# Patient Record
Sex: Female | Born: 1985 | Race: Black or African American | Hispanic: No | Marital: Single | Smoking: Never smoker
Health system: Southern US, Community
[De-identification: ages and names within clinical notes are randomized; demographics above are authoritative.]

## PROBLEM LIST (undated history)

## (undated) ENCOUNTER — Inpatient Hospital Stay (HOSPITAL_COMMUNITY): Payer: Self-pay

## (undated) DIAGNOSIS — Z8759 Personal history of other complications of pregnancy, childbirth and the puerperium: Secondary | ICD-10-CM

## (undated) DIAGNOSIS — IMO0001 Reserved for inherently not codable concepts without codable children: Secondary | ICD-10-CM

## (undated) DIAGNOSIS — O039 Complete or unspecified spontaneous abortion without complication: Secondary | ICD-10-CM

## (undated) HISTORY — PX: INDUCED ABORTION: SHX677

## (undated) HISTORY — DX: Reserved for inherently not codable concepts without codable children: IMO0001

## (undated) HISTORY — PX: WISDOM TOOTH EXTRACTION: SHX21

## (undated) HISTORY — DX: Complete or unspecified spontaneous abortion without complication: O03.9

## (undated) HISTORY — DX: Personal history of other complications of pregnancy, childbirth and the puerperium: Z87.59

---

## 2006-12-16 ENCOUNTER — Emergency Department (HOSPITAL_COMMUNITY): Admission: EM | Admit: 2006-12-16 | Discharge: 2006-12-16 | Payer: Self-pay | Admitting: Family Medicine

## 2006-12-22 ENCOUNTER — Emergency Department (HOSPITAL_COMMUNITY): Admission: EM | Admit: 2006-12-22 | Discharge: 2006-12-22 | Payer: Self-pay | Admitting: Emergency Medicine

## 2007-10-18 ENCOUNTER — Emergency Department (HOSPITAL_COMMUNITY): Admission: EM | Admit: 2007-10-18 | Discharge: 2007-10-18 | Payer: Self-pay | Admitting: Emergency Medicine

## 2011-06-22 LAB — WET PREP, GENITAL: Clue Cells Wet Prep HPF POC: NONE SEEN

## 2011-06-22 LAB — POCT URINALYSIS DIP (DEVICE)
Nitrite: NEGATIVE
Protein, ur: 30 — AB
Urobilinogen, UA: 0.2
pH: 6

## 2011-06-22 LAB — GC/CHLAMYDIA PROBE AMP, GENITAL: Chlamydia, DNA Probe: NEGATIVE

## 2011-06-22 LAB — POCT PREGNANCY, URINE
Operator id: 270961
Preg Test, Ur: NEGATIVE

## 2012-09-09 ENCOUNTER — Emergency Department (HOSPITAL_BASED_OUTPATIENT_CLINIC_OR_DEPARTMENT_OTHER): Payer: BC Managed Care – PPO

## 2012-09-09 ENCOUNTER — Encounter (HOSPITAL_BASED_OUTPATIENT_CLINIC_OR_DEPARTMENT_OTHER): Payer: Self-pay | Admitting: *Deleted

## 2012-09-09 ENCOUNTER — Emergency Department (HOSPITAL_BASED_OUTPATIENT_CLINIC_OR_DEPARTMENT_OTHER)
Admission: EM | Admit: 2012-09-09 | Discharge: 2012-09-09 | Disposition: A | Discharge status: Home / Self Care | Payer: BC Managed Care – PPO | Attending: Emergency Medicine | Admitting: Emergency Medicine

## 2012-09-09 DIAGNOSIS — O039 Complete or unspecified spontaneous abortion without complication: Secondary | ICD-10-CM | POA: Insufficient documentation

## 2012-09-09 DIAGNOSIS — O9989 Other specified diseases and conditions complicating pregnancy, childbirth and the puerperium: Secondary | ICD-10-CM | POA: Insufficient documentation

## 2012-09-09 DIAGNOSIS — B9689 Other specified bacterial agents as the cause of diseases classified elsewhere: Secondary | ICD-10-CM

## 2012-09-09 DIAGNOSIS — Z3202 Encounter for pregnancy test, result negative: Secondary | ICD-10-CM | POA: Insufficient documentation

## 2012-09-09 DIAGNOSIS — N760 Acute vaginitis: Secondary | ICD-10-CM | POA: Insufficient documentation

## 2012-09-09 LAB — CBC WITH DIFFERENTIAL/PLATELET
Basophils Relative: 0 % (ref 0–1)
Eosinophils Absolute: 0.1 10*3/uL (ref 0.0–0.7)
Lymphs Abs: 3.2 10*3/uL (ref 0.7–4.0)
MCH: 32.4 pg (ref 26.0–34.0)
MCHC: 34.7 g/dL (ref 30.0–36.0)
Neutro Abs: 2.6 10*3/uL (ref 1.7–7.7)
Neutrophils Relative %: 39 % — ABNORMAL LOW (ref 43–77)
Platelets: 262 10*3/uL (ref 150–400)
RBC: 4.2 MIL/uL (ref 3.87–5.11)

## 2012-09-09 LAB — URINALYSIS, ROUTINE W REFLEX MICROSCOPIC
Ketones, ur: 15 mg/dL — AB
Nitrite: NEGATIVE
pH: 6 (ref 5.0–8.0)

## 2012-09-09 LAB — URINE MICROSCOPIC-ADD ON

## 2012-09-09 LAB — BASIC METABOLIC PANEL
GFR calc Af Amer: >90 mL/min (ref >90)
GFR calc non Af Amer: >90 mL/min (ref >90)
Potassium: 3.3 mEq/L — ABNORMAL LOW (ref 3.5–5.1)
Sodium: 139 mEq/L (ref 135–145)

## 2012-09-09 LAB — PREGNANCY, URINE: Preg Test, Ur: NEGATIVE

## 2012-09-09 LAB — ABO/RH

## 2012-09-09 MED ORDER — METRONIDAZOLE 500 MG PO TABS: 500.0000 mg | ORAL_TABLET | Freq: Two times a day (BID) | ORAL | Status: DC

## 2012-09-09 NOTE — ED Notes (Signed)
Pt reports she is P3G0A2. Both abortions were elective.

## 2012-09-09 NOTE — Progress Notes (Signed)
9:28 AM Blood type is O+.  Ultrasound showed no IUP.    Advised Rx with metronidazole for bacterial vaginosis, followup with Andrea Saunders to recheck for possible early pregnancy vs. Miscarriage.

## 2012-09-09 NOTE — ED Provider Notes (Addendum)
History     CSN: [id number]  Arrival date & time 09/09/12  4098   First MD Initiated Contact with Patient 09/09/12 [phone]      Chief Complaint  Patient presents with  . Vaginal Bleeding    (Consider location/radiation/quality/duration/timing/severity/associated sxs/prior treatment) Patient is a 26 y.o. female presenting with vaginal bleeding. The history is provided by the patient.  Vaginal Bleeding This is a new problem. The current episode started less than 1 hour ago. The problem occurs constantly. The problem has not changed since onset.Associated symptoms comments: Pelvic cramping. Nothing aggravates the symptoms. Nothing relieves the symptoms. She has tried nothing for the symptoms. The treatment provided no relief.  Patient is G3P0 and 7 weeks by LMP who reports she was diagnosed with positive pregnancy test at her OB/GYN and started on prenatal vitamins and was having cramping that improved then awoke an hour ago with worsening cramping and new vaginal bleeding.     History reviewed. No pertinent past medical history.  History reviewed. No pertinent past surgical history.  No family history on file.  History  Substance Use Topics  . Smoking status: Never Smoker   . Smokeless tobacco: Not on file  . Alcohol Use: No    OB History    Grav Para Term Preterm Abortions TAB SAB Ect Mult Living                  Review of Systems  Genitourinary: Positive for vaginal bleeding and pelvic pain.  All other systems reviewed and are negative.    Allergies  Review of patient's allergies indicates no known allergies.  Home Medications   Current Outpatient Rx  Name  Route  Sig  Dispense  Refill  . PRENATAL MULTIVITAMIN CH   Oral   Take 1 tablet by mouth daily.           BP 124/81  Pulse 86  Temp 98.4 F (36.9 C) (Oral)  Resp 20  Ht 5\' 5"  (1.651 m)  Wt 115 lb (52.164 kg)  BMI 19.14 kg/m2  SpO2 100%  LMP 07/25/2012  Physical Exam  Constitutional: She appears  well-developed and well-nourished.  HENT:  Head: Normocephalic and atraumatic.  Mouth/Throat: Oropharynx is clear and moist.  Eyes: Conjunctivae normal are normal. Pupils are equal, round, and reactive to light.  Neck: Normal range of motion. Neck supple.  Cardiovascular: Normal rate and regular rhythm.   Pulmonary/Chest: Effort normal and breath sounds normal.  Abdominal: Soft. Bowel sounds are normal. There is no tenderness. There is no rebound and no guarding.  Genitourinary: Cervix exhibits no motion tenderness. Right adnexum displays no mass. Left adnexum displays no mass.       Chaperone present behind the curtain.  Vaginal bleeding mild with one clot.    Musculoskeletal: Normal range of motion. She exhibits no edema.  Neurological: She is alert.  Skin: Skin is warm and dry. She is not diaphoretic.  Psychiatric: Her mood appears anxious.    ED Course  Procedures (including critical care time)  Labs Reviewed  URINALYSIS, ROUTINE W REFLEX MICROSCOPIC - Abnormal; Notable for the following:    Color, Urine RED (*)  BIOCHEMICALS MAY BE AFFECTED BY COLOR   APPearance TURBID (*)     Hgb urine dipstick LARGE (*)     Bilirubin Urine SMALL (*)     Ketones, ur 15 (*)     Protein, ur 100 (*)     Leukocytes, UA SMALL (*)     All  other components within normal limits  WET PREP, GENITAL - Abnormal; Notable for the following:    Clue Cells Wet Prep HPF POC MODERATE (*)     WBC, Wet Prep HPF POC MANY (*)     All other components within normal limits  URINE MICROSCOPIC-ADD ON - Abnormal; Notable for the following:    Squamous Epithelial / LPF MANY (*)     Bacteria, UA MANY (*)     All other components within normal limits  PREGNANCY, URINE  CBC WITH DIFFERENTIAL  BASIC METABOLIC PANEL  ABO/RH  GC/CHLAMYDIA PROBE AMP  HCG, QUANTITATIVE, PREGNANCY  URINE CULTURE   No results found.   No diagnosis found.    MDM  Patient tearful. Wanted to know how things looked prior to exam  and labs, patient informed we would need to complete the exam and testing to give her further information.  Patient tearful offered kleenex.   642 case d/w Dr. Gaynell Face on call for Dr. Alwyn Ren.  Ultrasound and Beta HCG  Patient signed out to Dr. Ignacia Palma pending ultrasound and RH.  If RH negative will need rhogam   Repeatedly asked patient if there was anyone we could contact for her to come be with her.  Patient denied.  Explained we were waiting for a blood test and the ultrasound as someone would be coming in to perform exam after 8 am.  Patient wanted to know if this could be fixed in ED so she could have a normal pregnancy.  Explained to patient that based on beta HCG testing here with negative pregnancy test it is likely she had miscarried at some point and her hormone levels were declining in light of the fact that she had positive urine pregnancy tests which would be indicative of higher hormone levels.  With Vickie patient's nurse present, patient informed of beta HCG of 7 and likelihood of miscarriage.  Patient also informed that she will need to follow up with Dr. Clearance Coots this week for ongoing care.  Patient verbalizes understanding and agrees to follow up  Anai Lipson Smitty Cords, MD 09/09/12 [phone]

## 2012-09-09 NOTE — ED Notes (Signed)
Pt tearful while Dr. Nicanor Alcon explaining results. Dr. Nicanor Alcon encouraged multiple times to call someone for pt. Pt refused.

## 2012-09-09 NOTE — ED Notes (Signed)
During pelvic exam was behind curtain while exam was performed

## 2012-09-09 NOTE — ED Notes (Signed)
Pt sts she awoke this am with lower abd cramping. She sts she noticed blood in her underwear and there was blood present on the toilet paper when she wiped.

## 2012-09-09 NOTE — ED Notes (Signed)
Lady Gary from [facility] called to report pt's blood type. Pt is O+

## 2012-09-10 LAB — GC/CHLAMYDIA PROBE AMP: CT Probe RNA: NEGATIVE

## 2012-09-10 LAB — URINE CULTURE: Colony Count: NO GROWTH

## 2013-02-19 ENCOUNTER — Ambulatory Visit (INDEPENDENT_AMBULATORY_CARE_PROVIDER_SITE_OTHER): Payer: BC Managed Care – PPO | Admitting: Obstetrics

## 2013-02-19 ENCOUNTER — Encounter: Payer: Self-pay | Admitting: Obstetrics

## 2013-02-19 VITALS — BP 123/86 | HR 78 | Temp 98.5°F | Wt 123.0 lb

## 2013-02-19 DIAGNOSIS — M549 Dorsalgia, unspecified: Secondary | ICD-10-CM

## 2013-02-19 DIAGNOSIS — R109 Unspecified abdominal pain: Secondary | ICD-10-CM

## 2013-02-19 DIAGNOSIS — Z3202 Encounter for pregnancy test, result negative: Secondary | ICD-10-CM

## 2013-02-19 DIAGNOSIS — N760 Acute vaginitis: Secondary | ICD-10-CM

## 2013-02-19 LAB — POCT URINE PREGNANCY: Preg Test, Ur: NEGATIVE

## 2013-02-19 LAB — POCT URINALYSIS DIPSTICK
Bilirubin, UA: NEGATIVE
Ketones, UA: NEGATIVE

## 2013-02-19 MED ORDER — METRONIDAZOLE 0.75 % VA GEL: 1.0000 | Freq: Two times a day (BID) | VAGINAL | Status: DC

## 2013-02-19 MED ORDER — IBUPROFEN 800 MG PO TABS: 800.0000 mg | ORAL_TABLET | Freq: Three times a day (TID) | ORAL | Status: DC | PRN

## 2013-02-19 MED ORDER — CYCLOBENZAPRINE HCL 10 MG PO TABS: 10.0000 mg | ORAL_TABLET | Freq: Three times a day (TID) | ORAL | Status: DC | PRN

## 2013-02-19 NOTE — Progress Notes (Signed)
Subjective:     Andrea Saunders is a 27 y.o. female here for problem visit.  Current complaints: lower abdominal pain, lower back pain, and increased urination x 2 weeks.     Gynecologic History Patient's last menstrual period was 01/19/2013. Contraception: none    The following portions of the patient's history were reviewed and updated as appropriate: allergies, current medications, past family history, past medical history, past social history, past surgical history and problem list.  Review of Systems Pertinent items are noted in HPI.    Objective:    General appearance: alert and no distress Abdomen: normal findings: soft, non-tender Pelvic: cervix normal in appearance, external genitalia normal, no adnexal masses or tenderness, no cervical motion tenderness, uterus normal size, shape, and consistency and vagina normal without discharge    Assessment:    Backache  BV.   Plan:    Education reviewed: Management of backache..    Flexeril/Ibuprofen Rx.  [facility] Rx.

## 2013-02-20 ENCOUNTER — Encounter: Payer: Self-pay | Admitting: Obstetrics

## 2013-02-20 DIAGNOSIS — M549 Dorsalgia, unspecified: Secondary | ICD-10-CM | POA: Insufficient documentation

## 2013-02-20 DIAGNOSIS — R109 Unspecified abdominal pain: Secondary | ICD-10-CM | POA: Insufficient documentation

## 2013-02-20 DIAGNOSIS — N760 Acute vaginitis: Secondary | ICD-10-CM | POA: Insufficient documentation

## 2013-02-20 LAB — WET PREP BY MOLECULAR PROBE: Candida species: NEGATIVE

## 2013-04-23 ENCOUNTER — Ambulatory Visit: Payer: BC Managed Care – PPO | Admitting: Obstetrics

## 2013-05-18 ENCOUNTER — Ambulatory Visit: Payer: BC Managed Care – PPO | Admitting: Obstetrics

## 2013-06-08 ENCOUNTER — Ambulatory Visit: Payer: BC Managed Care – PPO | Admitting: Obstetrics

## 2013-06-29 ENCOUNTER — Other Ambulatory Visit: Payer: Self-pay | Admitting: Obstetrics

## 2013-09-10 ENCOUNTER — Ambulatory Visit (INDEPENDENT_AMBULATORY_CARE_PROVIDER_SITE_OTHER): Payer: BC Managed Care – PPO | Admitting: Obstetrics

## 2013-09-10 ENCOUNTER — Ambulatory Visit: Payer: BC Managed Care – PPO | Admitting: Obstetrics

## 2013-09-10 ENCOUNTER — Encounter: Payer: Self-pay | Admitting: Obstetrics

## 2013-09-10 VITALS — BP 123/82 | HR 77 | Temp 97.9°F | Ht 65.0 in | Wt 127.0 lb

## 2013-09-10 DIAGNOSIS — Z0000 Encounter for general adult medical examination without abnormal findings: Secondary | ICD-10-CM

## 2013-09-10 DIAGNOSIS — Z01419 Encounter for gynecological examination (general) (routine) without abnormal findings: Secondary | ICD-10-CM

## 2013-09-10 DIAGNOSIS — B9689 Other specified bacterial agents as the cause of diseases classified elsewhere: Secondary | ICD-10-CM | POA: Insufficient documentation

## 2013-09-10 LAB — POCT URINALYSIS DIPSTICK
Ketones, UA: NEGATIVE
Leukocytes, UA: NEGATIVE
Protein, UA: NEGATIVE
Spec Grav, UA: 1.02

## 2013-09-10 MED ORDER — PNV PRENATAL PLUS MULTIVITAMIN 27-1 MG PO TABS: 1.0000 | ORAL_TABLET | Freq: Every day | ORAL | Status: DC

## 2013-09-10 MED ORDER — METRONIDAZOLE 500 MG PO TABS: 500.0000 mg | ORAL_TABLET | Freq: Two times a day (BID) | ORAL | Status: DC

## 2013-09-10 NOTE — Progress Notes (Signed)
   Subjective:     Andrea Saunders is a 27 y.o. female here for a routine exam.  Current complaints:.Pt in office today for annual exam, reports increased discharge follow menstrual cycle with odor denies irritation  Personal health questionnaire reviewed: yes.   Gynecologic History Patient's last menstrual period was 08/22/2013. Contraception: none Last Pap: 2012. Results were: normal Last mammogram: N/A  Obstetric History OB History  No data available     The following portions of the patient's history were reviewed and updated as appropriate: allergies, current medications, past family history, past medical history, past social history, past surgical history and problem list.  Review of Systems Pertinent items are noted in HPI.    Objective:    General appearance: alert and no distress Breasts: normal appearance, no masses or tenderness Abdomen: normal findings: soft, non-tender Pelvic: cervix normal in appearance, external genitalia normal, no adnexal masses or tenderness, no cervical motion tenderness, rectovaginal septum normal, uterus normal size, shape, and consistency and vagina normal without discharge    Assessment:    Healthy female exam.   BV   Plan:    Follow up in: 1 year.    Flagyl Rx

## 2013-09-11 ENCOUNTER — Encounter: Payer: Self-pay | Admitting: Obstetrics

## 2013-09-11 LAB — PAP IG W/ RFLX HPV ASCU

## 2013-09-11 LAB — WET PREP BY MOLECULAR PROBE
Candida species: NEGATIVE
Gardnerella vaginalis: POSITIVE — AB
Trichomonas vaginosis: NEGATIVE

## 2013-09-11 LAB — GC/CHLAMYDIA PROBE AMP
CT Probe RNA: NEGATIVE
GC Probe RNA: NEGATIVE

## 2013-09-30 ENCOUNTER — Other Ambulatory Visit (INDEPENDENT_AMBULATORY_CARE_PROVIDER_SITE_OTHER): Payer: BC Managed Care – PPO

## 2013-09-30 VITALS — BP 133/90 | HR 77 | Temp 97.9°F | Ht 65.0 in | Wt 128.0 lb

## 2013-09-30 DIAGNOSIS — N912 Amenorrhea, unspecified: Secondary | ICD-10-CM

## 2013-10-02 ENCOUNTER — Other Ambulatory Visit: Payer: Self-pay | Admitting: *Deleted

## 2013-10-14 ENCOUNTER — Encounter: Payer: Self-pay | Admitting: Obstetrics

## 2013-10-14 ENCOUNTER — Ambulatory Visit (INDEPENDENT_AMBULATORY_CARE_PROVIDER_SITE_OTHER): Payer: BC Managed Care – PPO | Admitting: Obstetrics

## 2013-10-14 VITALS — BP 128/86 | Temp 97.2°F | Wt 137.0 lb

## 2013-10-14 DIAGNOSIS — O219 Vomiting of pregnancy, unspecified: Secondary | ICD-10-CM

## 2013-10-14 DIAGNOSIS — Z3400 Encounter for supervision of normal first pregnancy, unspecified trimester: Secondary | ICD-10-CM

## 2013-10-14 DIAGNOSIS — B3731 Acute candidiasis of vulva and vagina: Secondary | ICD-10-CM

## 2013-10-14 DIAGNOSIS — O210 Mild hyperemesis gravidarum: Secondary | ICD-10-CM

## 2013-10-14 DIAGNOSIS — Z3201 Encounter for pregnancy test, result positive: Secondary | ICD-10-CM

## 2013-10-14 DIAGNOSIS — B373 Candidiasis of vulva and vagina: Secondary | ICD-10-CM

## 2013-10-14 LAB — POCT URINALYSIS DIPSTICK
Bilirubin, UA: NEGATIVE
Blood, UA: NEGATIVE
GLUCOSE UA: NEGATIVE
Ketones, UA: NEGATIVE
Nitrite, UA: NEGATIVE
Protein, UA: NEGATIVE
SPEC GRAV UA: 1.01
UROBILINOGEN UA: NEGATIVE
pH, UA: 6

## 2013-10-14 MED ORDER — VITAMIN B-6 100 MG PO TABS: 100.0000 mg | ORAL_TABLET | Freq: Every day | ORAL | Status: DC

## 2013-10-14 MED ORDER — FOLIC ACID 1 MG PO TABS: 1.0000 mg | ORAL_TABLET | Freq: Every day | ORAL | Status: DC

## 2013-10-14 MED ORDER — DOXYLAMINE SUCCINATE (SLEEP) 25 MG PO TABS: 25.0000 mg | ORAL_TABLET | Freq: Every day | ORAL | Status: DC

## 2013-10-14 MED ORDER — BUTOCONAZOLE NITRATE (1 DOSE) 2 % VA CREA: 1.0000 | TOPICAL_CREAM | Freq: Every evening | VAGINAL | Status: DC | PRN

## 2013-10-14 NOTE — Progress Notes (Signed)
Doing well 

## 2013-10-14 NOTE — Progress Notes (Signed)
P 72 Patient reports she is doing well- is having some nausea. Subjective:    Andrea Saunders is being seen today for her first obstetrical visit.  This is not a planned pregnancy. She is at 8143w6d gestation. Her obstetrical history is significant for none. Relationship with FOB: significant other, not living together. Patient does intend to breast feed. Pregnancy history fully reviewed.  Menstrual History: OB History   Grav Para Term Preterm Abortions TAB SAB Ect Mult Living   4    3 2 1          Menarche age: [age] Patient's last menstrual period was 08/27/2013.    The following portions of the patient's history were reviewed and updated as appropriate: allergies, current medications, past family history, past medical history, past social history, past surgical history and problem list.  Review of Systems Pertinent items are noted in HPI.    Objective:    General appearance: alert and no distress Abdomen: normal findings: soft, non-tender Pelvic: cervix normal in appearance, external genitalia normal, no adnexal masses or tenderness, no cervical motion tenderness, rectovaginal septum normal, uterus normal size, shape, and consistency and vagina normal without discharge Extremities: extremities normal, atraumatic, no cyanosis or edema    Assessment:    Pregnancy at 8043w6d weeks    Plan:    Initial labs drawn. Prenatal vitamins.  Counseling provided regarding continued use of seat belts, cessation of alcohol consumption, smoking or use of illicit drugs; infection precautions i.e., influenza/TDAP immunizations, toxoplasmosis,CMV, parvovirus, listeria and varicella; workplace safety, exercise during pregnancy; routine dental care, safe medications, sexual activity, hot tubs, saunas, pools, travel, caffeine use, fish and methlymercury, potential toxins, hair treatments, varicose veins Weight gain recommendations per IOM guidelines reviewed: underweight/BMI< 18.5--> gain 28 - 40 lbs; normal  weight/BMI 18.5 - 24.9--> gain 25 - 35 lbs; overweight/BMI 25 - 29.9--> gain 15 - 25 lbs; obese/BMI >30->gain  11 - 20 lbs Problem list reviewed and updated. FIRST/CF mutation testing/NIPT/QUAD SCREEN discussed: requested. Role of ultrasound in pregnancy discussed; fetal survey: requested. Amniocentesis discussed: not indicated. VBAC calculator score: VBAC consent form provided Follow up in 4 weeks. 50% of 20 min visit spent on counseling and coordination of care.

## 2013-10-15 LAB — GC/CHLAMYDIA PROBE AMP
CT Probe RNA: NEGATIVE
GC PROBE AMP APTIMA: NEGATIVE

## 2013-10-15 LAB — WET PREP BY MOLECULAR PROBE
CANDIDA SPECIES: POSITIVE — AB
Gardnerella vaginalis: POSITIVE — AB
TRICHOMONAS VAG: NEGATIVE

## 2013-10-15 LAB — CULTURE, OB URINE
COLONY COUNT: NO GROWTH
Organism ID, Bacteria: NO GROWTH

## 2013-10-29 ENCOUNTER — Other Ambulatory Visit: Payer: Self-pay | Admitting: *Deleted

## 2013-10-29 DIAGNOSIS — B9689 Other specified bacterial agents as the cause of diseases classified elsewhere: Secondary | ICD-10-CM

## 2013-10-29 DIAGNOSIS — B373 Candidiasis of vulva and vagina: Secondary | ICD-10-CM

## 2013-10-29 DIAGNOSIS — B3731 Acute candidiasis of vulva and vagina: Secondary | ICD-10-CM

## 2013-10-29 DIAGNOSIS — N760 Acute vaginitis: Principal | ICD-10-CM

## 2013-10-29 MED ORDER — METRONIDAZOLE 500 MG PO TABS: 500.0000 mg | ORAL_TABLET | Freq: Two times a day (BID) | ORAL | Status: DC

## 2013-10-29 MED ORDER — FLUCONAZOLE 150 MG PO TABS: 150.0000 mg | ORAL_TABLET | Freq: Once | ORAL | Status: DC

## 2013-11-11 ENCOUNTER — Other Ambulatory Visit: Payer: Self-pay | Admitting: Obstetrics

## 2013-11-11 ENCOUNTER — Ambulatory Visit (INDEPENDENT_AMBULATORY_CARE_PROVIDER_SITE_OTHER): Payer: BC Managed Care – PPO | Admitting: Obstetrics

## 2013-11-11 ENCOUNTER — Encounter: Payer: Self-pay | Admitting: Obstetrics

## 2013-11-11 ENCOUNTER — Other Ambulatory Visit: Payer: Self-pay | Admitting: *Deleted

## 2013-11-11 DIAGNOSIS — Z3400 Encounter for supervision of normal first pregnancy, unspecified trimester: Secondary | ICD-10-CM

## 2013-11-11 LAB — HIV ANTIBODY (ROUTINE TESTING W REFLEX): HIV: NONREACTIVE

## 2013-11-11 MED ORDER — CLINDAMYCIN HCL 300 MG PO CAPS: 300.0000 mg | ORAL_CAPSULE | Freq: Two times a day (BID) | ORAL | Status: DC

## 2013-11-11 NOTE — Progress Notes (Signed)
Pulse: 77 Patient states she tried calling and left a message last Monday about Metronidazole tablets that were making her sick and that no one got back to her until Friday. Patient states she was given another medication but hasn't taken it yet because she was wanting to talk to the nurse about it.

## 2013-11-11 NOTE — Addendum Note (Signed)
Addended by: Coral CeoHARPER, Gervase Colberg A on: 11/11/2013 10:16 AM   Modules accepted: Orders, Medications

## 2013-11-12 LAB — OBSTETRIC PANEL
Antibody Screen: NEGATIVE
Basophils Absolute: 0 10*3/uL (ref 0.0–0.1)
Basophils Relative: 0 % (ref 0–1)
Eosinophils Absolute: 0.2 10*3/uL (ref 0.0–0.7)
Eosinophils Relative: 2 % (ref 0–5)
HCT: 38.3 % (ref 36.0–46.0)
Hemoglobin: 12.8 g/dL (ref 12.0–15.0)
Hepatitis B Surface Ag: NEGATIVE
LYMPHS PCT: 31 % (ref 12–46)
Lymphs Abs: 3 10*3/uL (ref 0.7–4.0)
MCH: 32 pg (ref 26.0–34.0)
MCHC: 33.4 g/dL (ref 30.0–36.0)
MCV: 95.8 fL (ref 78.0–100.0)
Monocytes Absolute: 1.1 10*3/uL — ABNORMAL HIGH (ref 0.1–1.0)
Monocytes Relative: 11 % (ref 3–12)
Neutro Abs: 5.4 10*3/uL (ref 1.7–7.7)
Neutrophils Relative %: 56 % (ref 43–77)
Platelets: 354 10*3/uL (ref 150–400)
RBC: 4 MIL/uL (ref 3.87–5.11)
RDW: 13.6 % (ref 11.5–15.5)
RUBELLA: 3.65 {index} — AB (ref <0.90)
Rh Type: POSITIVE
WBC: 9.7 10*3/uL (ref 4.0–10.5)

## 2013-11-12 LAB — VITAMIN D 25 HYDROXY (VIT D DEFICIENCY, FRACTURES): Vit D, 25-Hydroxy: 22 ng/mL — ABNORMAL LOW (ref 30–89)

## 2013-11-13 LAB — HEMOGLOBINOPATHY EVALUATION
HGB F QUANT: 1.1 % (ref 0.0–2.0)
HGB S QUANTITAION: 0 %
Hemoglobin Other: 0 %
Hgb A2 Quant: 2.5 % (ref 2.2–3.2)
Hgb A: 96.4 % — ABNORMAL LOW (ref 96.8–97.8)

## 2013-11-16 ENCOUNTER — Other Ambulatory Visit: Payer: Self-pay | Admitting: *Deleted

## 2013-11-16 DIAGNOSIS — E559 Vitamin D deficiency, unspecified: Secondary | ICD-10-CM

## 2013-11-16 MED ORDER — OB COMPLETE PETITE 35-5-1-200 MG PO CAPS: 1.0000 | ORAL_CAPSULE | Freq: Every day | ORAL | Status: DC

## 2013-12-02 ENCOUNTER — Encounter: Payer: Self-pay | Admitting: Obstetrics

## 2013-12-16 ENCOUNTER — Ambulatory Visit (INDEPENDENT_AMBULATORY_CARE_PROVIDER_SITE_OTHER): Payer: BC Managed Care – PPO | Admitting: Obstetrics

## 2013-12-16 VITALS — BP 127/78 | Temp 98.1°F | Wt 147.0 lb

## 2013-12-16 DIAGNOSIS — Z1389 Encounter for screening for other disorder: Secondary | ICD-10-CM

## 2013-12-16 DIAGNOSIS — Z3400 Encounter for supervision of normal first pregnancy, unspecified trimester: Secondary | ICD-10-CM

## 2013-12-16 DIAGNOSIS — Z3009 Encounter for other general counseling and advice on contraception: Secondary | ICD-10-CM

## 2013-12-16 LAB — POCT URINALYSIS DIPSTICK
Bilirubin, UA: NEGATIVE
Glucose, UA: NEGATIVE
Ketones, UA: NEGATIVE
NITRITE UA: NEGATIVE
PH UA: 6
PROTEIN UA: NEGATIVE
RBC UA: NEGATIVE
Spec Grav, UA: 1.015
UROBILINOGEN UA: NEGATIVE

## 2013-12-16 MED ORDER — NORETHIN-ETH ESTRAD-FE BIPHAS 1 MG-10 MCG / 10 MCG PO TABS: 1.0000 | ORAL_TABLET | Freq: Every day | ORAL | Status: DC

## 2013-12-16 NOTE — Progress Notes (Signed)
Pulse 73 Pt states that she has had some lower pelvic pain. Pt denies any bleeding.  Pt state that she previously had a yeast infection and thinks she has one now.  Pt would like to discuss treatment.

## 2013-12-17 ENCOUNTER — Encounter: Payer: Self-pay | Admitting: Obstetrics

## 2013-12-17 LAB — AFP, QUAD SCREEN
AFP: 30.6 [IU]/mL
CURR GEST AGE: 15.6 wks.days
Down Syndrome Scr Risk Est: 1:1370 {titer}
HCG, Total: 81850 m[IU]/mL
INH: 340.9 pg/mL
Interpretation-AFP: NEGATIVE
MOM FOR AFP: 0.9
MoM for INH: 1.77
MoM for hCG: 2.93
Open Spina bifida: NEGATIVE
Osb Risk: 1:11900 {titer}
Tri 18 Scr Risk Est: NEGATIVE
UE3 VALUE: 0.6 ng/mL
uE3 Mom: 1.46

## 2014-01-05 ENCOUNTER — Other Ambulatory Visit: Payer: Self-pay | Admitting: *Deleted

## 2014-01-05 DIAGNOSIS — B3731 Acute candidiasis of vulva and vagina: Secondary | ICD-10-CM

## 2014-01-05 DIAGNOSIS — B373 Candidiasis of vulva and vagina: Secondary | ICD-10-CM

## 2014-01-05 MED ORDER — TERCONAZOLE 0.4 % VA CREA: 1.0000 | TOPICAL_CREAM | Freq: Every day | VAGINAL | Status: DC

## 2014-01-06 ENCOUNTER — Encounter: Payer: Self-pay | Admitting: Obstetrics

## 2014-01-12 ENCOUNTER — Ambulatory Visit (INDEPENDENT_AMBULATORY_CARE_PROVIDER_SITE_OTHER): Payer: BC Managed Care – PPO

## 2014-01-12 DIAGNOSIS — Z1389 Encounter for screening for other disorder: Secondary | ICD-10-CM

## 2014-01-13 ENCOUNTER — Encounter: Payer: Self-pay | Admitting: Obstetrics

## 2014-01-13 ENCOUNTER — Encounter: Payer: BC Managed Care – PPO | Admitting: Obstetrics

## 2014-01-13 ENCOUNTER — Ambulatory Visit (INDEPENDENT_AMBULATORY_CARE_PROVIDER_SITE_OTHER): Payer: BC Managed Care – PPO | Admitting: Obstetrics

## 2014-01-13 ENCOUNTER — Other Ambulatory Visit: Payer: BC Managed Care – PPO

## 2014-01-13 VITALS — BP 127/80 | Temp 98.2°F | Wt 154.0 lb

## 2014-01-13 DIAGNOSIS — Z3400 Encounter for supervision of normal first pregnancy, unspecified trimester: Secondary | ICD-10-CM

## 2014-01-13 LAB — POCT URINALYSIS DIPSTICK
Bilirubin, UA: NEGATIVE
Blood, UA: NEGATIVE
CLARITY UA: NEGATIVE
GLUCOSE UA: NEGATIVE
KETONES UA: NEGATIVE
Nitrite, UA: NEGATIVE
Protein, UA: NEGATIVE
Spec Grav, UA: 1.015
Urobilinogen, UA: NEGATIVE
pH, UA: 5

## 2014-01-13 LAB — US OB COMP + 14 WK

## 2014-01-13 NOTE — Progress Notes (Signed)
Pulse 96 Pt states that she has been having some contractions.  Pt states that she has been increasing PO fluids and ctx are better.

## 2014-01-22 ENCOUNTER — Encounter: Payer: Self-pay | Admitting: Obstetrics

## 2014-01-22 LAB — US OB COMP + 14 WK

## 2014-01-25 ENCOUNTER — Encounter (HOSPITAL_COMMUNITY): Payer: Self-pay | Admitting: *Deleted

## 2014-01-25 ENCOUNTER — Inpatient Hospital Stay (HOSPITAL_COMMUNITY)
Admission: AD | Admit: 2014-01-25 | Discharge: 2014-01-25 | Disposition: A | Discharge status: Home / Self Care | Payer: BC Managed Care – PPO | Source: Ambulatory Visit | Attending: Obstetrics & Gynecology | Admitting: Obstetrics & Gynecology

## 2014-01-25 DIAGNOSIS — N949 Unspecified condition associated with female genital organs and menstrual cycle: Secondary | ICD-10-CM | POA: Insufficient documentation

## 2014-01-25 DIAGNOSIS — R109 Unspecified abdominal pain: Secondary | ICD-10-CM | POA: Insufficient documentation

## 2014-01-25 DIAGNOSIS — O9989 Other specified diseases and conditions complicating pregnancy, childbirth and the puerperium: Principal | ICD-10-CM

## 2014-01-25 DIAGNOSIS — Z8249 Family history of ischemic heart disease and other diseases of the circulatory system: Secondary | ICD-10-CM | POA: Insufficient documentation

## 2014-01-25 DIAGNOSIS — O99891 Other specified diseases and conditions complicating pregnancy: Secondary | ICD-10-CM | POA: Insufficient documentation

## 2014-01-25 LAB — URINE MICROSCOPIC-ADD ON

## 2014-01-25 LAB — URINALYSIS, ROUTINE W REFLEX MICROSCOPIC
Bilirubin Urine: NEGATIVE
GLUCOSE, UA: NEGATIVE mg/dL
HGB URINE DIPSTICK: NEGATIVE
KETONES UR: NEGATIVE mg/dL
Nitrite: NEGATIVE
Protein, ur: NEGATIVE mg/dL
Specific Gravity, Urine: 1.015 (ref 1.005–1.030)
Urobilinogen, UA: 0.2 mg/dL (ref 0.0–1.0)
pH: 6 (ref 5.0–8.0)

## 2014-01-25 NOTE — MAU Provider Note (Signed)
None     Chief Complaint:  Abdominal Pain   Andrea Saunders is  28 y.o. G4P0030 at 1041w4d presents complaining of pain on her right lower side. Pt stated having pain on her lower abdomen right sided. 1-->8/10 sharp pain. Pt states that worse with walking, improves with resting. Has not taken any medication. Unable to sleep on right side because of pain. Pain has been occuring over last couple of weeks but worse today.  Normal FM, no lof, no vb, no ctx.  No f/c, no ha, no vision change, no n/v, no d/c. No urinary or bowel symptoms.   Obstetrical/Gynecological History: OB History   Grav Para Term Preterm Abortions TAB SAB Ect Mult Living   4    3 2 1         Past Medical History: Past Medical History  Diagnosis Date  . History of miscarriage   . Abortion     x 2    Past Surgical History: Past Surgical History  Procedure Laterality Date  . Induced abortion      x 2  . Wisdom tooth extraction      Family History: Family History  Problem Relation Age of Onset  . Hypertension Mother   . Hypertension Sister     Social History: History  Substance Use Topics  . Smoking status: Never Smoker   . Smokeless tobacco: Never Used  . Alcohol Use: No     Comment: rare    Allergies: No Known Allergies  Meds:  Prescriptions prior to admission  Medication Sig Dispense Refill  . prenatal vitamin w/FE, FA (NATACHEW) 29-1 MG CHEW chewable tablet Chew 2 tablets by mouth daily at 12 noon.      Marland Kitchen. terconazole (TERAZOL 7) 0.4 % vaginal cream Place 1 applicator vaginally at bedtime.  45 g  0    Review of Systems -   Review of Systems  See above.   Physical Exam  Blood pressure 128/74, pulse 84, temperature 98.4 F (36.9 C), temperature source Oral, resp. rate 16, height 5' 4.5" (1.638 m), weight 68.584 kg (151 lb 3.2 oz), last menstrual period 08/27/2013, SpO2 100.00%. GENERAL: Well-developed, well-nourished female in no acute distress.  LUNGS: Clear to auscultation bilaterally.   HEART: Regular rate and rhythm. ABDOMEN: Soft, minimally TTP on RLQ, no rebound. No CVAT, , nondistended, gravid.  EXTREMITIES: Nontender, no edema, 2+ distal pulses. DTR's 2+   FHT: 142   Labs: Results for orders placed during the hospital encounter of 01/25/14 (from the past 24 hour(s))  URINALYSIS, ROUTINE W REFLEX MICROSCOPIC   Collection Time    01/25/14 10:50 AM      Result Value Ref Range   Color, Urine YELLOW  YELLOW   APPearance HAZY (*) CLEAR   Specific Gravity, Urine 1.015  1.005 - 1.030   pH 6.0  5.0 - 8.0   Glucose, UA NEGATIVE  NEGATIVE mg/dL   Hgb urine dipstick NEGATIVE  NEGATIVE   Bilirubin Urine NEGATIVE  NEGATIVE   Ketones, ur NEGATIVE  NEGATIVE mg/dL   Protein, ur NEGATIVE  NEGATIVE mg/dL   Urobilinogen, UA 0.2  0.0 - 1.0 mg/dL   Nitrite NEGATIVE  NEGATIVE   Leukocytes, UA SMALL (*) NEGATIVE  URINE MICROSCOPIC-ADD ON   Collection Time    01/25/14 10:50 AM      Result Value Ref Range   Squamous Epithelial / LPF FEW (*) RARE   WBC, UA 0-2  <3 WBC/hpf   RBC / HPF 0-2  <  3 RBC/hpf   Bacteria, UA RARE  RARE   Imaging Studies:  No results found.  Assessment: Andrea Saunders is  28 y.o. G4P0030 at 5865w4d presents with round ligament pain. APAP PRN, recommend pregnancy belt. Unlikely infectious given ROS. No evidence of UTI. Follow up in clinic in 3weeks as previously scheduled with Dr. Clearance CootsHarper.  Minta BalsamMichael R Vallen Calabrese 4/27/20151:24 PM

## 2014-01-25 NOTE — Discharge Instructions (Signed)
Abdominal Pain, Women °Abdominal (stomach, pelvic, or belly) pain can be caused by many things. It is important to tell your doctor: °· The location of the pain. °· Does it come and go or is it present all the time? °· Are there things that start the pain (eating certain foods, exercise)? °· Are there other symptoms associated with the pain (fever, nausea, vomiting, diarrhea)? °All of this is helpful to know when trying to find the cause of the pain. °CAUSES  °· Stomach: virus or bacteria infection, or ulcer. °· Intestine: appendicitis (inflamed appendix), regional ileitis (Crohn's disease), ulcerative colitis (inflamed colon), irritable bowel syndrome, diverticulitis (inflamed diverticulum of the colon), or cancer of the stomach or intestine. °· Gallbladder disease or stones in the gallbladder. °· Kidney disease, kidney stones, or infection. °· Pancreas infection or cancer. °· Fibromyalgia (pain disorder). °· Diseases of the female organs: °· Uterus: fibroid (non-cancerous) tumors or infection. °· Fallopian tubes: infection or tubal pregnancy. °· Ovary: cysts or tumors. °· Pelvic adhesions (scar tissue). °· Endometriosis (uterus lining tissue growing in the pelvis and on the pelvic organs). °· Pelvic congestion syndrome (female organs filling up with blood just before the menstrual period). °· Pain with the menstrual period. °· Pain with ovulation (producing an egg). °· Pain with an IUD (intrauterine device, birth control) in the uterus. °· Cancer of the female organs. °· Functional pain (pain not caused by a disease, may improve without treatment). °· Psychological pain. °· Depression. °DIAGNOSIS  °Your doctor will decide the seriousness of your pain by doing an examination. °· Blood tests. °· X-rays. °· Ultrasound. °· CT scan (computed tomography, special type of X-ray). °· MRI (magnetic resonance imaging). °· Cultures, for infection. °· Barium enema (dye inserted in the large intestine, to better view it with  X-rays). °· Colonoscopy (looking in intestine with a lighted tube). °· Laparoscopy (minor surgery, looking in abdomen with a lighted tube). °· Major abdominal exploratory surgery (looking in abdomen with a large incision). °TREATMENT  °The treatment will depend on the cause of the pain.  °· Many cases can be observed and treated at home. °· Over-the-counter medicines recommended by your caregiver. °· Prescription medicine. °· Antibiotics, for infection. °· Birth control pills, for painful periods or for ovulation pain. °· Hormone treatment, for endometriosis. °· Nerve blocking injections. °· Physical therapy. °· Antidepressants. °· Counseling with a psychologist or psychiatrist. °· Minor or major surgery. °HOME CARE INSTRUCTIONS  °· Do not take laxatives, unless directed by your caregiver. °· Take over-the-counter pain medicine only if ordered by your caregiver. Do not take aspirin because it can cause an upset stomach or bleeding. °· Try a clear liquid diet (broth or water) as ordered by your caregiver. Slowly move to a bland diet, as tolerated, if the pain is related to the stomach or intestine. °· Have a thermometer and take your temperature several times a day, and record it. °· Bed rest and sleep, if it helps the pain. °· Avoid sexual intercourse, if it causes pain. °· Avoid stressful situations. °· Keep your follow-up appointments and tests, as your caregiver orders. °· If the pain does not go away with medicine or surgery, you may try: °· Acupuncture. °· Relaxation exercises (yoga, meditation). °· Group therapy. °· Counseling. °SEEK MEDICAL CARE IF:  °· You notice certain foods cause stomach pain. °· Your home care treatment is not helping your pain. °· You need stronger pain medicine. °· You want your IUD removed. °· You feel faint or   lightheaded. °· You develop nausea and vomiting. °· You develop a rash. °· You are having side effects or an allergy to your medicine. °SEEK IMMEDIATE MEDICAL CARE IF:  °· Your  pain does not go away or gets worse. °· You have a fever. °· Your pain is felt only in portions of the abdomen. The right side could possibly be appendicitis. The left lower portion of the abdomen could be colitis or diverticulitis. °· You are passing blood in your stools (bright red or black tarry stools, with or without vomiting). °· You have blood in your urine. °· You develop chills, with or without a fever. °· You pass out. °MAKE SURE YOU:  °· Understand these instructions. °· Will watch your condition. °· Will get help right away if you are not doing well or get worse. °Document Released: 07/15/2007 Document Revised: 12/10/2011 Document Reviewed: 08/04/2009 °[employer]® Patient Information ©2014 [employer]. ° °

## 2014-01-25 NOTE — MAU Note (Signed)
Patient states she started to have right side pain this am that is worse with movement. Denies bleeding or leaking and has felt fetal movement. States she did have a little left side pain last night but today more on the right.

## 2014-01-28 ENCOUNTER — Encounter: Payer: BC Managed Care – PPO | Admitting: Obstetrics

## 2014-02-11 ENCOUNTER — Encounter: Payer: BC Managed Care – PPO | Admitting: Obstetrics

## 2014-03-01 ENCOUNTER — Telehealth: Payer: Self-pay | Admitting: *Deleted

## 2014-03-01 ENCOUNTER — Encounter: Payer: Self-pay | Admitting: Obstetrics

## 2014-03-01 ENCOUNTER — Ambulatory Visit (INDEPENDENT_AMBULATORY_CARE_PROVIDER_SITE_OTHER): Payer: BC Managed Care – PPO | Admitting: Obstetrics

## 2014-03-01 VITALS — BP 114/77 | HR 83 | Temp 97.9°F | Wt 163.0 lb

## 2014-03-01 DIAGNOSIS — M549 Dorsalgia, unspecified: Secondary | ICD-10-CM

## 2014-03-01 DIAGNOSIS — Z3400 Encounter for supervision of normal first pregnancy, unspecified trimester: Secondary | ICD-10-CM

## 2014-03-01 MED ORDER — TRAMADOL HCL 50 MG PO TABS: 100.0000 mg | ORAL_TABLET | Freq: Four times a day (QID) | ORAL | Status: DC | PRN

## 2014-03-01 NOTE — Telephone Encounter (Signed)
Patient c/o sharp pain in her L side. Patient reports was more active this weekend. Patient states she has sharp pains on L side-  Shooting pain down leg- patient states it did get better with rest. Patient request an appointment- per Dr Clearance Coots 1:00 today.

## 2014-03-01 NOTE — Progress Notes (Signed)
Subjective:    Andrea Saunders is a 28 y.o. female being seen today for her obstetrical visit. She is at [redacted]w[redacted]d gestation. Patient reports: backache and left leg pain . Fetal movement: normal.  Problem List Items Addressed This Visit   None    Visit Diagnoses   Supervision of normal first pregnancy    -  Primary    Relevant Orders       POCT urinalysis dipstick    Backache        Relevant Medications       Tramadol HCL (ULTRAM) 50 mg po tab      Patient Active Problem List   Diagnosis Date Noted  . Nausea and vomiting in pregnancy prior to [redacted] weeks gestation 10/14/2013  . BV (bacterial vaginosis) 09/10/2013  . 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 Vaginitis and vulvovaginitis, unspecified 02/20/2013  . Abdominal pain in female patient 02/20/2013   Objective:    BP 114/77  Pulse 83  Temp(Src) 97.9 F (36.6 C)  Wt 163 lb (73.936 kg)  LMP 08/27/2013 FHT: 150 BPM  Uterine Size: size equals dates     Assessment:    Pregnancy @ [redacted]w[redacted]d    Backache and left leg pain.  Probable muscle strain.  Plan:   Ultram Rx   OBGCT: ordered.  Next visit.  Labs, problem list reviewed and updated 2 hr GTT planned Follow up in 2 weeks.

## 2014-03-08 ENCOUNTER — Other Ambulatory Visit: Payer: BC Managed Care – PPO

## 2014-03-08 ENCOUNTER — Ambulatory Visit (INDEPENDENT_AMBULATORY_CARE_PROVIDER_SITE_OTHER): Payer: BC Managed Care – PPO | Admitting: Obstetrics

## 2014-03-08 VITALS — BP 120/80 | HR 80 | Temp 98.4°F | Wt 164.0 lb

## 2014-03-08 DIAGNOSIS — Z3480 Encounter for supervision of other normal pregnancy, unspecified trimester: Secondary | ICD-10-CM

## 2014-03-08 DIAGNOSIS — Z3400 Encounter for supervision of normal first pregnancy, unspecified trimester: Secondary | ICD-10-CM

## 2014-03-08 LAB — CBC
HCT: 32.6 % — ABNORMAL LOW (ref 36.0–46.0)
Hemoglobin: 11 g/dL — ABNORMAL LOW (ref 12.0–15.0)
MCH: 31.8 pg (ref 26.0–34.0)
MCHC: 33.7 g/dL (ref 30.0–36.0)
MCV: 94.2 fL (ref 78.0–100.0)
Platelets: 357 10*3/uL (ref 150–400)
RBC: 3.46 MIL/uL — ABNORMAL LOW (ref 3.87–5.11)
RDW: 13.5 % (ref 11.5–15.5)
WBC: 9.5 10*3/uL (ref 4.0–10.5)

## 2014-03-08 LAB — POCT URINALYSIS DIPSTICK
BILIRUBIN UA: NEGATIVE
GLUCOSE UA: NEGATIVE
KETONES UA: NEGATIVE
Nitrite, UA: NEGATIVE
Protein, UA: NEGATIVE
RBC UA: NEGATIVE
Spec Grav, UA: <=1.005
Urobilinogen, UA: NEGATIVE
pH, UA: 7

## 2014-03-09 ENCOUNTER — Encounter: Payer: Self-pay | Admitting: Obstetrics

## 2014-03-09 LAB — GLUCOSE TOLERANCE, 2 HOURS W/ 1HR
GLUCOSE, 2 HOUR: 91 mg/dL (ref 70–139)
GLUCOSE: 106 mg/dL (ref 70–170)
Glucose, Fasting: 58 mg/dL — ABNORMAL LOW (ref 70–99)

## 2014-03-09 LAB — HIV ANTIBODY (ROUTINE TESTING W REFLEX): HIV: NONREACTIVE

## 2014-03-09 LAB — RPR

## 2014-03-09 NOTE — Progress Notes (Signed)
Subjective:    Andrea Saunders is a 28 y.o. female being seen today for her obstetrical visit. She is at [redacted]w[redacted]d gestation. Patient reports: no complaints . Fetal movement: normal.  Problem List Items Addressed This Visit   None    Visit Diagnoses   Supervision of other normal pregnancy    -  Primary    Relevant Orders       POCT urinalysis dipstick (Completed)      Patient Active Problem List   Diagnosis Date Noted  . Nausea and vomiting in pregnancy prior to [redacted] weeks gestation 10/14/2013  . BV (bacterial vaginosis) 09/10/2013  . 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 Vaginitis and vulvovaginitis, unspecified 02/20/2013  . Abdominal pain in female patient 02/20/2013   Objective:    BP 120/80  Pulse 80  Temp(Src) 98.4 F (36.9 C)  Wt 164 lb (74.39 kg)  LMP 08/27/2013 FHT: 150 BPM  Uterine Size: size equals dates     Assessment:    Pregnancy @ [redacted]w[redacted]d    Plan:    OBGCT: ordered.  Labs, problem list reviewed and updated 2 hr GTT planned Follow up in 2 weeks.

## 2014-03-10 ENCOUNTER — Telehealth: Payer: Self-pay | Admitting: *Deleted

## 2014-03-10 NOTE — Telephone Encounter (Signed)
Patient is requesting a yeast medication- as she discussed with Dr Clearance Coots at her last visit.

## 2014-03-12 NOTE — Telephone Encounter (Signed)
Diflucan 150 mg po 

## 2014-03-16 ENCOUNTER — Other Ambulatory Visit: Payer: Self-pay | Admitting: *Deleted

## 2014-03-16 DIAGNOSIS — N760 Acute vaginitis: Secondary | ICD-10-CM

## 2014-03-16 MED ORDER — FLUCONAZOLE 150 MG PO TABS: 150.0000 mg | ORAL_TABLET | Freq: Every day | ORAL | Status: DC

## 2014-03-16 NOTE — Telephone Encounter (Signed)
Rx sent to pharmacy   

## 2014-03-22 ENCOUNTER — Encounter: Payer: Self-pay | Admitting: Obstetrics

## 2014-03-22 ENCOUNTER — Ambulatory Visit (INDEPENDENT_AMBULATORY_CARE_PROVIDER_SITE_OTHER): Payer: BC Managed Care – PPO | Admitting: Obstetrics

## 2014-03-22 VITALS — BP 113/73 | HR 101 | Temp 97.4°F | Wt 164.0 lb

## 2014-03-22 DIAGNOSIS — L700 Acne vulgaris: Secondary | ICD-10-CM

## 2014-03-22 DIAGNOSIS — Z3400 Encounter for supervision of normal first pregnancy, unspecified trimester: Secondary | ICD-10-CM

## 2014-03-22 DIAGNOSIS — L708 Other acne: Secondary | ICD-10-CM

## 2014-03-22 DIAGNOSIS — Z3403 Encounter for supervision of normal first pregnancy, third trimester: Secondary | ICD-10-CM

## 2014-03-22 LAB — POCT URINALYSIS DIPSTICK
Bilirubin, UA: NEGATIVE
Glucose, UA: NEGATIVE
Ketones, UA: NEGATIVE
Leukocytes, UA: NEGATIVE
Nitrite, UA: NEGATIVE
PROTEIN UA: NEGATIVE
RBC UA: NEGATIVE
Spec Grav, UA: 1.01
UROBILINOGEN UA: NEGATIVE
pH, UA: 7

## 2014-03-22 MED ORDER — CLINDAMYCIN PHOSPHATE 1 % EX GEL: Freq: Two times a day (BID) | CUTANEOUS | Status: DC

## 2014-03-22 NOTE — Progress Notes (Signed)
Subjective:    Andrea Saunders is a 28 y.o. female being seen today for her obstetrical visit. She is at 6860w4d gestation. Patient reports acne on face, back and arms. Fetal movement: normal.  Problem List Items Addressed This Visit   None    Visit Diagnoses   Encounter for supervision of normal first pregnancy in third trimester    -  Primary    Relevant Orders       POCT urinalysis dipstick    Acne comedone        Relevant Medications       clindamycin (CLINDAGEL) 1 % gel      Patient Active Problem List   Diagnosis Date Noted  . Nausea and vomiting in pregnancy prior to [redacted] weeks gestation 10/14/2013  . BV (bacterial vaginosis) 09/10/2013  . 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 Vaginitis and vulvovaginitis, unspecified 02/20/2013  . Abdominal pain in female patient 02/20/2013   Objective:    BP 113/73  Pulse 101  Temp(Src) 97.4 F (36.3 C)  Wt 164 lb (74.39 kg)  LMP 08/27/2013 FHT:  150 BPM  Uterine Size: size equals dates  Presentation: unsure     Assessment:    Pregnancy @ 4760w4d weeks   Plan:     labs reviewed, problem list updated Consent signed. GBS sent TDAP offered  Rhogam given for RH negative Pediatrician: discussed. Infant feeding: plans to breastfeed. Maternity leave: discussed. Cigarette smoking: never smoked. Orders Placed This Encounter  Procedures  . POCT urinalysis dipstick   Meds ordered this encounter  Medications  . DISCONTD: clindamycin (CLINDAGEL) 1 % gel    Sig: Apply topically 2 (two) times daily.    Dispense:  30 g    Refill:  11  . clindamycin (CLINDAGEL) 1 % gel    Sig: Apply topically 2 (two) times daily.    Dispense:  60 g    Refill:  2   Follow up in 2 Weeks.

## 2014-03-30 ENCOUNTER — Encounter: Payer: Self-pay | Admitting: Obstetrics

## 2014-03-30 ENCOUNTER — Ambulatory Visit (INDEPENDENT_AMBULATORY_CARE_PROVIDER_SITE_OTHER): Payer: BC Managed Care – PPO | Admitting: Obstetrics

## 2014-03-30 DIAGNOSIS — B373 Candidiasis of vulva and vagina: Secondary | ICD-10-CM

## 2014-03-30 DIAGNOSIS — Z3403 Encounter for supervision of normal first pregnancy, third trimester: Secondary | ICD-10-CM

## 2014-03-30 DIAGNOSIS — Z3400 Encounter for supervision of normal first pregnancy, unspecified trimester: Secondary | ICD-10-CM

## 2014-03-30 DIAGNOSIS — B3731 Acute candidiasis of vulva and vagina: Secondary | ICD-10-CM

## 2014-03-30 MED ORDER — TERCONAZOLE 0.4 % VA CREA: 1.0000 | TOPICAL_CREAM | Freq: Every day | VAGINAL | Status: DC

## 2014-03-30 NOTE — Progress Notes (Signed)
Subjective:    Andrea Saunders is a 28 y.o. female being seen today for her obstetrical visit. She is at 3245w5d gestation. Patient reports vaginal irritation. Fetal movement: normal.  Problem List Items Addressed This Visit   None    Visit Diagnoses   Encounter for supervision of normal first pregnancy in third trimester    -  Primary    Relevant Orders       POCT urinalysis dipstick      Patient Active Problem List   Diagnosis Date Noted  . Nausea and vomiting in pregnancy prior to [redacted] weeks gestation 10/14/2013  . BV (bacterial vaginosis) 09/10/2013  . 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 Vaginitis and vulvovaginitis, unspecified 02/20/2013  . Abdominal pain in female patient 02/20/2013   Objective:    LMP 08/27/2013 FHT:  150 BPM  Uterine Size: size equals dates  Presentation: unsure     Assessment:    Pregnancy @ 4045w5d weeks   Vulvovaginal yeast.  Plan:   Terazol 7 Rx.    labs reviewed, problem list updated Consent signed. GBS sent TDAP offered  Rhogam given for RH negative Pediatrician: discussed. Infant feeding: plans to breastfeed. Maternity leave: not discussed. Cigarette smoking: never smoked. Orders Placed This Encounter  Procedures  . POCT urinalysis dipstick   No orders of the defined types were placed in this encounter.   Follow up in 2 Weeks.

## 2014-03-31 ENCOUNTER — Encounter: Payer: Self-pay | Admitting: Obstetrics

## 2014-04-05 ENCOUNTER — Encounter: Payer: BC Managed Care – PPO | Admitting: Obstetrics

## 2014-04-05 LAB — POCT URINALYSIS DIPSTICK
Bilirubin, UA: NEGATIVE
Glucose, UA: NEGATIVE
Ketones, UA: NEGATIVE
Nitrite, UA: NEGATIVE
PH UA: 7
Protein, UA: NEGATIVE
RBC UA: NEGATIVE
UROBILINOGEN UA: NEGATIVE

## 2014-04-13 ENCOUNTER — Encounter: Payer: BC Managed Care – PPO | Admitting: Obstetrics

## 2014-04-15 ENCOUNTER — Encounter: Payer: Self-pay | Admitting: *Deleted

## 2014-04-15 ENCOUNTER — Ambulatory Visit (INDEPENDENT_AMBULATORY_CARE_PROVIDER_SITE_OTHER): Payer: BC Managed Care – PPO | Admitting: Obstetrics

## 2014-04-15 VITALS — BP 118/81 | HR 84 | Temp 98.8°F | Wt 171.0 lb

## 2014-04-15 DIAGNOSIS — Z3400 Encounter for supervision of normal first pregnancy, unspecified trimester: Secondary | ICD-10-CM

## 2014-04-15 DIAGNOSIS — Z3403 Encounter for supervision of normal first pregnancy, third trimester: Secondary | ICD-10-CM

## 2014-04-16 ENCOUNTER — Encounter: Payer: Self-pay | Admitting: Obstetrics

## 2014-04-16 NOTE — Progress Notes (Signed)
Subjective:    Andrea Saunders is a [age] y.o. female being seen today for her obstetrical visit. She is at 4035w1d gestation. Patient reports no complaints. Fetal movement: normal.  Problem List Items Addressed This Visit   None    Visit Diagnoses   Encounter for supervision of normal first pregnancy in third trimester    -  Primary    Relevant Orders       POCT urinalysis dipstick      Patient Active Problem List   Diagnosis Date Noted  . Nausea and vomiting in pregnancy prior to [redacted] weeks gestation 10/14/2013  . BV (bacterial vaginosis) 09/10/2013  . 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 Vaginitis and vulvovaginitis, unspecified 02/20/2013  . Abdominal pain in female patient 02/20/2013   Objective:    BP 118/81  Pulse 84  Temp(Src) 98.8 F (37.1 C)  Wt 171 lb (77.565 kg)  LMP 08/27/2013 FHT:  150 BPM  Uterine Size: size equals dates  Presentation: unsure     Assessment:    Pregnancy @ 1635w1d weeks   Plan:     labs reviewed, problem list updated Consent signed. GBS sent TDAP offered  Rhogam given for RH negative Pediatrician: discussed. Infant feeding: plans to breastfeed. Maternity leave: discussed. Cigarette smoking: never smoked. Orders Placed This Encounter  Procedures  . POCT urinalysis dipstick   No orders of the defined types were placed in this encounter.   Follow up in 1 Week.

## 2014-04-29 ENCOUNTER — Ambulatory Visit (INDEPENDENT_AMBULATORY_CARE_PROVIDER_SITE_OTHER): Payer: BC Managed Care – PPO | Admitting: Obstetrics

## 2014-04-29 VITALS — BP 112/76 | HR 99 | Temp 97.5°F | Wt 177.0 lb

## 2014-04-29 DIAGNOSIS — Z3400 Encounter for supervision of normal first pregnancy, unspecified trimester: Secondary | ICD-10-CM

## 2014-04-29 DIAGNOSIS — Z3403 Encounter for supervision of normal first pregnancy, third trimester: Secondary | ICD-10-CM

## 2014-04-29 NOTE — Progress Notes (Signed)
Subjective:    Andrea Saunders is a 28 y.o. female being seen today for her obstetrical visit. She is at 6276w0d gestation. Patient reports backache, occasional contractions and increased pelvic pressure and discomfort.. Fetal movement: normal.  Problem List Items Addressed This Visit   None     Patient Active Problem List   Diagnosis Date Noted  . Nausea and vomiting in pregnancy prior to [redacted] weeks gestation 10/14/2013  . BV (bacterial vaginosis) 09/10/2013  . 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 Vaginitis and vulvovaginitis, unspecified 02/20/2013  . Abdominal pain in female patient 02/20/2013   Objective:    BP 112/76  Pulse 99  Temp(Src) 97.5 F (36.4 C)  Wt 177 lb (80.287 kg)  LMP 08/27/2013 FHT:  150 BPM  Uterine Size: size equals dates  Presentation: unsure     Assessment:    Pregnancy @ 5376w0d weeks   Plan:     labs reviewed, problem list updated Consent signed. GBS sent TDAP offered  Rhogam given for RH negative Pediatrician: discussed. Infant feeding: plans to breastfeed. Maternity leave: discussed. Cigarette smoking: never smoked. No orders of the defined types were placed in this encounter.   No orders of the defined types were placed in this encounter.   Follow up in 1 Week.

## 2014-04-29 NOTE — Addendum Note (Signed)
Addended by: Marya LandryFOSTER, Meiling Hendriks D on: 04/29/2014 04:32 PM   Modules accepted: Orders

## 2014-04-30 LAB — STREP B DNA PROBE: GBSP: DETECTED

## 2014-05-06 ENCOUNTER — Ambulatory Visit (INDEPENDENT_AMBULATORY_CARE_PROVIDER_SITE_OTHER): Payer: BC Managed Care – PPO | Admitting: Obstetrics

## 2014-05-06 ENCOUNTER — Encounter: Payer: Self-pay | Admitting: Obstetrics

## 2014-05-06 VITALS — BP 126/79 | HR 86 | Temp 97.8°F | Wt 175.0 lb

## 2014-05-06 DIAGNOSIS — Z3400 Encounter for supervision of normal first pregnancy, unspecified trimester: Secondary | ICD-10-CM

## 2014-05-06 LAB — POCT URINALYSIS DIPSTICK
Blood, UA: NEGATIVE
Glucose, UA: NEGATIVE
KETONES UA: NEGATIVE
Nitrite, UA: NEGATIVE
PH UA: 8
Spec Grav, UA: <=1.005

## 2014-05-06 NOTE — Progress Notes (Signed)
Subjective:    Andrea Saunders is a 28 y.o. female being seen today for her obstetrical visit. She is at 8482w0d gestation. Patient reports no complaints. Fetal movement: normal.  Problem List Items Addressed This Visit   None    Visit Diagnoses   Encounter for supervision of normal first pregnancy in third trimester    -  Primary    Relevant Orders       POCT urinalysis dipstick      Patient Active Problem List   Diagnosis Date Noted  . Nausea and vomiting in pregnancy prior to [redacted] weeks gestation 10/14/2013  . BV (bacterial vaginosis) 09/10/2013  . 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 Vaginitis and vulvovaginitis, unspecified 02/20/2013  . Abdominal pain in female patient 02/20/2013   Objective:    BP 126/79  Pulse 86  Temp(Src) 97.8 F (36.6 C)  Wt 175 lb (79.379 kg)  LMP 08/27/2013 FHT:  150 BPM  Uterine Size: size equals dates  Presentation: cephalic     Assessment:    Pregnancy @ 3682w0d weeks   Plan:     labs reviewed, problem list updated Consent signed. GBS sent TDAP offered  Rhogam given for RH negative Pediatrician: discussed. Infant feeding: plans to breastfeed. Maternity leave: discussed. Cigarette smoking: never smoked. Orders Placed This Encounter  Procedures  . POCT urinalysis dipstick   No orders of the defined types were placed in this encounter.   Follow up in 1 Week.

## 2014-05-06 NOTE — Addendum Note (Signed)
Addended by: Elby BeckPAUL, JANE F on: 05/06/2014 04:57 PM   Modules accepted: Orders

## 2014-05-07 LAB — URINE CULTURE
COLONY COUNT: NO GROWTH
Organism ID, Bacteria: NO GROWTH

## 2014-05-09 ENCOUNTER — Encounter (HOSPITAL_COMMUNITY): Payer: Self-pay | Admitting: *Deleted

## 2014-05-09 ENCOUNTER — Inpatient Hospital Stay (HOSPITAL_COMMUNITY)
Admission: AD | Admit: 2014-05-09 | Discharge: 2014-05-09 | Disposition: A | Discharge status: Home / Self Care | Payer: BC Managed Care – PPO | Source: Ambulatory Visit | Attending: Obstetrics & Gynecology | Admitting: Obstetrics & Gynecology

## 2014-05-09 DIAGNOSIS — N949 Unspecified condition associated with female genital organs and menstrual cycle: Secondary | ICD-10-CM | POA: Insufficient documentation

## 2014-05-09 MED ORDER — ACETAMINOPHEN 325 MG PO TABS
650.0000 mg | ORAL_TABLET | Freq: Once | ORAL | Status: AC
  Administered 2014-05-09: 650 mg via ORAL
  Filled 2014-05-09: qty 2

## 2014-05-09 NOTE — MAU Provider Note (Signed)
  History     CSN: [id number]  Arrival date and time: 05/09/14 1445   First Provider Initiated Contact with Patient 05/09/14 1514      Chief Complaint  Patient presents with  . Pelvic Pain   HPI Comments: Delos HaringLatoya S Trenkamp 28 y.o. [phone] [redacted]w[redacted]d presents to MAU with lower pelvic pains that have been ongoing for weeks. She feels that this morning was worse and that concerned her. She has not taken any medications or any therapies to relieve this pain. She denies any LOF or vaginal bleeding.  Pelvic Pain      Past Medical History  Diagnosis Date  . History of miscarriage   . Abortion     x 2    Past Surgical History  Procedure Laterality Date  . Induced abortion      x 2  . Wisdom tooth extraction      Family History  Problem Relation Age of Onset  . Hypertension Mother   . Hypertension Sister     History  Substance Use Topics  . Smoking status: Never Smoker   . Smokeless tobacco: Never Used  . Alcohol Use: No     Comment: rare    Allergies: No Known Allergies  Prescriptions prior to admission  Medication Sig Dispense Refill  . prenatal vitamin w/FE, FA (NATACHEW) 29-1 MG CHEW chewable tablet Chew 2 tablets by mouth daily at 12 noon.      Marland Kitchen. terconazole (TERAZOL 7) 0.4 % vaginal cream Place 1 applicator vaginally at bedtime.         Review of Systems  Constitutional: Negative.   HENT: Negative.   Eyes: Negative.   Respiratory: Negative.   Cardiovascular: Negative.   Genitourinary: Negative.        Low pelvic pains  Musculoskeletal: Negative.   Skin: Negative.   Neurological: Negative.   Psychiatric/Behavioral: Negative.    Physical Exam   Blood pressure 123/90, pulse 120, temperature 98.1 F (36.7 C), temperature source Oral, resp. rate 18, last menstrual period 08/27/2013.  Physical Exam  Constitutional: She is oriented to person, place, and time. She appears well-developed and well-nourished. No distress.  HENT:  Head: Normocephalic and  atraumatic.  Eyes: Pupils are equal, round, and reactive to light.  GI: Soft. Bowel sounds are normal. She exhibits no distension and no mass. There is tenderness. There is no rebound and no guarding.  Round ligament pains bilateral  Genitourinary:  Not examined  Musculoskeletal: Normal range of motion.  Neurological: She is alert and oriented to person, place, and time.  Skin: Skin is warm and dry.  Psychiatric: She has a normal mood and affect. Her behavior is normal. Judgment and thought content normal.   TOCO:  140 FHR, reactive without contractions  MAU Course  Procedures  MDM  Tylenol now  Assessment and Plan   A: Round Ligaments Pains  P: Discussed taking tylenol prn Discussed maternity belt, warm baths Follow up with Dr Clent RidgesHarper  Majesty Stehlin, Rubbie BattiestLinda Miller 05/09/2014, 3:40 PM

## 2014-05-09 NOTE — MAU Note (Signed)
Pelvic pain that started this morning that has gotten worse throughout the day. Denies LOF/VB.

## 2014-05-09 NOTE — Discharge Instructions (Signed)
Abdominal Pain During Pregnancy °Belly (abdominal) pain is common during pregnancy. Most of the time, it is not a serious problem. Other times, it can be a sign that something is wrong with the pregnancy. Always tell your doctor if you have belly pain. °HOME CARE °Monitor your belly pain for any changes. The following actions may help you feel better: °· Do not have sex (intercourse) or put anything in your vagina until you feel better. °· Rest until your pain stops. °· Drink clear fluids if you feel sick to your stomach (nauseous). Do not eat solid food until you feel better. °· Only take medicine as told by your doctor. °· Keep all doctor visits as told. °GET HELP RIGHT AWAY IF:  °· You are bleeding, leaking fluid, or pieces of tissue come out of your vagina. °· You have more pain or cramping. °· You keep throwing up (vomiting). °· You have pain when you pee (urinate) or have blood in your pee. °· You have a fever. °· You do not feel your baby moving as much. °· You feel very weak or feel like passing out. °· You have trouble breathing, with or without belly pain. °· You have a very bad headache and belly pain. °· You have fluid leaking from your vagina and belly pain. °· You keep having watery poop (diarrhea). °· Your belly pain does not go away after resting, or the pain gets worse. °MAKE SURE YOU:  °· Understand these instructions. °· Will watch your condition. °· Will get help right away if you are not doing well or get worse. °Document Released: 09/05/2009 Document Revised: 05/20/2013 Document Reviewed: 04/16/2013 °[employer]® Patient Information ©2015 [employer]. This information is not intended to replace advice given to you by your health care provider. Make sure you discuss any questions you have with your health care provider. ° °

## 2014-05-13 ENCOUNTER — Ambulatory Visit (INDEPENDENT_AMBULATORY_CARE_PROVIDER_SITE_OTHER): Payer: BC Managed Care – PPO | Admitting: Obstetrics

## 2014-05-13 VITALS — BP 120/70 | Temp 98.2°F | Wt 176.0 lb

## 2014-05-13 DIAGNOSIS — L299 Pruritus, unspecified: Secondary | ICD-10-CM

## 2014-05-13 DIAGNOSIS — Z3400 Encounter for supervision of normal first pregnancy, unspecified trimester: Secondary | ICD-10-CM

## 2014-05-13 DIAGNOSIS — Z3403 Encounter for supervision of normal first pregnancy, third trimester: Secondary | ICD-10-CM

## 2014-05-13 LAB — POCT URINALYSIS DIPSTICK
Bilirubin, UA: NEGATIVE
Blood, UA: NEGATIVE
GLUCOSE UA: NEGATIVE
KETONES UA: NEGATIVE
Nitrite, UA: NEGATIVE
PROTEIN UA: NEGATIVE
Spec Grav, UA: 1.01
Urobilinogen, UA: NEGATIVE
pH, UA: 7

## 2014-05-14 ENCOUNTER — Encounter: Payer: Self-pay | Admitting: Obstetrics

## 2014-05-14 LAB — COMPREHENSIVE METABOLIC PANEL
ALK PHOS: 301 U/L — AB (ref 39–117)
ALT: 13 U/L (ref 0–35)
AST: 16 U/L (ref 0–37)
Albumin: 3.4 g/dL — ABNORMAL LOW (ref 3.5–5.2)
BUN: 4 mg/dL — AB (ref 6–23)
CO2: 22 meq/L (ref 19–32)
Calcium: 9 mg/dL (ref 8.4–10.5)
Chloride: 105 mEq/L (ref 96–112)
Creat: 0.61 mg/dL (ref 0.50–1.10)
GLUCOSE: 65 mg/dL — AB (ref 70–99)
Potassium: 4.4 mEq/L (ref 3.5–5.3)
SODIUM: 136 meq/L (ref 135–145)
TOTAL PROTEIN: 6.2 g/dL (ref 6.0–8.3)
Total Bilirubin: 0.3 mg/dL (ref 0.2–1.2)

## 2014-05-14 NOTE — Progress Notes (Signed)
Subjective:    Andrea Saunders is a 28 y.o. female being seen today for her obstetrical visit. She is at 17w1dgestation. Patient reports no complaints. Fetal movement: normal.  Problem List Items Addressed This Visit   None    Visit Diagnoses   Itching    -  Primary    Relevant Orders       Bile acids, total       Comp Met (CMET)      Patient Active Problem List   Diagnosis Date Noted  . Nausea and vomiting in pregnancy prior to [redacted] weeks gestation 10/14/2013  . BV (bacterial vaginosis) 09/10/2013  . 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 Vaginitis and vulvovaginitis, unspecified 02/20/2013  . Abdominal pain in female patient 02/20/2013    Objective:    BP 120/70  Temp(Src) 98.2 F (36.8 C)  Wt 176 lb (79.833 kg)  LMP 08/27/2013 FHT: 150 BPM  Uterine Size: size equals dates  Presentations: unsure  Pelvic Exam: Deferred    Assessment:    Pregnancy @ 32w1deeks   Plan:   Plans for delivery: Vaginal anticipated; labs reviewed; problem list updated Counseling: Consent signed. Infant feeding: plans to breastfeed. Cigarette smoking: never smoked. L&D discussion: symptoms of labor, discussed when to call, discussed what number to call, anesthetic/analgesic options reviewed and delivering clinician:  plans Physician. Postpartum supports and preparation: circumcision discussed and contraception plans discussed.  Follow up in 1 Week.

## 2014-05-15 LAB — BILE ACIDS, TOTAL: Bile Acids Total: 15 umol/L (ref 0–19)

## 2014-05-17 NOTE — Addendum Note (Signed)
Addended by: Odessa FlemingBOHNE, Marvis Saefong M on: 05/17/2014 11:14 AM   Modules accepted: Orders

## 2014-05-20 ENCOUNTER — Encounter: Payer: Self-pay | Admitting: Obstetrics

## 2014-05-20 ENCOUNTER — Ambulatory Visit (INDEPENDENT_AMBULATORY_CARE_PROVIDER_SITE_OTHER): Payer: BC Managed Care – PPO | Admitting: Obstetrics

## 2014-05-20 VITALS — BP 116/82 | Temp 98.3°F | Wt 177.0 lb

## 2014-05-20 DIAGNOSIS — Z3403 Encounter for supervision of normal first pregnancy, third trimester: Secondary | ICD-10-CM

## 2014-05-20 DIAGNOSIS — Z3400 Encounter for supervision of normal first pregnancy, unspecified trimester: Secondary | ICD-10-CM

## 2014-05-20 LAB — POCT URINALYSIS DIPSTICK
Bilirubin, UA: NEGATIVE
Glucose, UA: NEGATIVE
Ketones, UA: NEGATIVE
Nitrite, UA: NEGATIVE
Protein, UA: NEGATIVE
RBC UA: NEGATIVE
SPEC GRAV UA: 1.015
UROBILINOGEN UA: NEGATIVE
pH, UA: 6

## 2014-05-20 NOTE — Progress Notes (Signed)
Subjective:    Andrea HaringLatoya S Saunders is a 28 y.o. female being seen today for her obstetrical visit. She is at 7656w0d gestation. Patient reports occasional contractions. Fetal movement: normal.  Problem List Items Addressed This Visit   None    Visit Diagnoses   Encounter for supervision of normal first pregnancy in third trimester    -  Primary    Relevant Orders       POCT urinalysis dipstick (Completed)      Patient Active Problem List   Diagnosis Date Noted  . Nausea and vomiting in pregnancy prior to [redacted] weeks gestation 10/14/2013  . BV (bacterial vaginosis) 09/10/2013  . 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 Vaginitis and vulvovaginitis, unspecified 02/20/2013  . Abdominal pain in female patient 02/20/2013    Objective:    BP 116/82  Temp(Src) 98.3 F (36.8 C)  Wt 177 lb (80.287 kg)  LMP 08/27/2013 FHT: 150 BPM  Uterine Size: size equals dates  Presentations: cephalic  Pelvic Exam: Deferred    Assessment:    Pregnancy @ 7956w0d weeks   Plan:   Plans for delivery: Vaginal anticipated; labs reviewed; problem list updated Counseling: Consent signed. Infant feeding: plans to breastfeed. Cigarette smoking: never smoked. L&D discussion: symptoms of labor, discussed when to call, discussed what number to call, anesthetic/analgesic options reviewed and delivering clinician:  plans Physician. Postpartum supports and preparation: circumcision discussed and contraception plans discussed.  Follow up in 1 Week.

## 2014-05-26 ENCOUNTER — Encounter: Payer: Self-pay | Admitting: Obstetrics

## 2014-05-26 ENCOUNTER — Ambulatory Visit (INDEPENDENT_AMBULATORY_CARE_PROVIDER_SITE_OTHER): Payer: BC Managed Care – PPO | Admitting: Obstetrics

## 2014-05-26 ENCOUNTER — Telehealth: Payer: Self-pay | Admitting: *Deleted

## 2014-05-26 VITALS — BP 127/83 | HR 95 | Temp 97.8°F | Wt 180.0 lb

## 2014-05-26 DIAGNOSIS — B373 Candidiasis of vulva and vagina: Secondary | ICD-10-CM

## 2014-05-26 DIAGNOSIS — B3731 Acute candidiasis of vulva and vagina: Secondary | ICD-10-CM

## 2014-05-26 DIAGNOSIS — Z3400 Encounter for supervision of normal first pregnancy, unspecified trimester: Secondary | ICD-10-CM

## 2014-05-26 DIAGNOSIS — Z3403 Encounter for supervision of normal first pregnancy, third trimester: Secondary | ICD-10-CM

## 2014-05-26 MED ORDER — TERCONAZOLE 0.4 % VA CREA: 1.0000 | TOPICAL_CREAM | Freq: Every day | VAGINAL | Status: DC

## 2014-05-26 NOTE — Progress Notes (Signed)
Subjective:    Andrea Saunders is a 28 y.o. female being seen today for her obstetrical visit. She is at [redacted]w[redacted]d gestation. Patient reports vaginal irritation. Fetal movement: normal.  Problem List Items Addressed This Visit   None    Visit Diagnoses   Encounter for supervision of normal first pregnancy in third trimester    -  Primary    Relevant Orders       POCT urinalysis dipstick       WET PREP BY MOLECULAR PROBE    Candidiasis of vulva and vagina        Relevant Medications       terconazole (TERAZOL 7) 0.4 % vaginal cream      Patient Active Problem List   Diagnosis Date Noted  . Nausea and vomiting in pregnancy prior to [redacted] weeks gestation 10/14/2013  . BV (bacterial vaginosis) 09/10/2013  . 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 Vaginitis and vulvovaginitis, unspecified 02/20/2013  . Abdominal pain in female patient 02/20/2013    Objective:    BP 127/83  Pulse 95  Temp(Src) 97.8 F (36.6 C)  Wt 180 lb (81.647 kg)  LMP 08/27/2013 FHT: 150 BPM  Uterine Size: size equals dates  Presentations: cephalic  Pelvic Exam: Deferred    Assessment:    Pregnancy @ [redacted]w[redacted]d weeks   Plan:   Plans for delivery: Vaginal anticipated; labs reviewed; problem list updated Counseling: Consent signed. Infant feeding: plans to breastfeed. Cigarette smoking: never smoked. L&D discussion: symptoms of labor, discussed when to call, discussed what number to call, anesthetic/analgesic options reviewed and delivering clinician:  plans Physician. Postpartum supports and preparation: circumcision discussed and contraception plans discussed.  Follow up in 1 Week.

## 2014-05-26 NOTE — Telephone Encounter (Signed)
Patient called c/o swelling and irritation of vulva. Appt today at 2:00

## 2014-05-27 ENCOUNTER — Encounter: Payer: BC Managed Care – PPO | Admitting: Obstetrics

## 2014-05-27 LAB — WET PREP BY MOLECULAR PROBE
Candida species: NEGATIVE
Gardnerella vaginalis: NEGATIVE
Trichomonas vaginosis: NEGATIVE

## 2014-05-31 ENCOUNTER — Inpatient Hospital Stay (HOSPITAL_COMMUNITY)
Admission: AD | Admit: 2014-05-31 | Discharge: 2014-05-31 | Disposition: A | Discharge status: Home / Self Care | Payer: BC Managed Care – PPO | Source: Ambulatory Visit | Attending: Obstetrics & Gynecology | Admitting: Obstetrics & Gynecology

## 2014-05-31 DIAGNOSIS — O479 False labor, unspecified: Secondary | ICD-10-CM | POA: Insufficient documentation

## 2014-05-31 NOTE — Discharge Instructions (Signed)
Third Trimester of Pregnancy The third trimester is from week 29 through week 42, months 7 through 9. The third trimester is a time when the fetus is growing rapidly. At the end of the ninth month, the fetus is about 20 inches in length and weighs 6-10 pounds.  BODY CHANGES Your body goes through many changes during pregnancy. The changes vary from woman to woman.   Your weight will continue to increase. You can expect to gain 25-35 pounds (11-16 kg) by the end of the pregnancy.  You may begin to get stretch marks on your hips, abdomen, and breasts.  You may urinate more often because the fetus is moving lower into your pelvis and pressing on your bladder.  You may develop or continue to have heartburn as a result of your pregnancy.  You may develop constipation because certain hormones are causing the muscles that push waste through your intestines to slow down.  You may develop hemorrhoids or swollen, bulging veins (varicose veins).  You may have pelvic pain because of the weight gain and pregnancy hormones relaxing your joints between the bones in your pelvis. Backaches may result from overexertion of the muscles supporting your posture.  You may have changes in your hair. These can include thickening of your hair, rapid growth, and changes in texture. Some women also have hair loss during or after pregnancy, or hair that feels dry or thin. Your hair will most likely return to normal after your baby is born.  Your breasts will continue to grow and be tender. A yellow discharge may leak from your breasts called colostrum.  Your belly button may stick out.  You may feel short of breath because of your expanding uterus.  You may notice the fetus "dropping," or moving lower in your abdomen.  You may have a bloody mucus discharge. This usually occurs a few days to a week before labor begins.  Your cervix becomes thin and soft (effaced) near your due date. WHAT TO EXPECT AT YOUR PRENATAL  EXAMS  You will have prenatal exams every 2 weeks until week 36. Then, you will have weekly prenatal exams. During a routine prenatal visit:  You will be weighed to make sure you and the fetus are growing normally.  Your blood pressure is taken.  Your abdomen will be measured to track your baby's growth.  The fetal heartbeat will be listened to.  Any test results from the previous visit will be discussed.  You may have a cervical check near your due date to see if you have effaced. At around 36 weeks, your caregiver will check your cervix. At the same time, your caregiver will also perform a test on the secretions of the vaginal tissue. This test is to determine if a type of bacteria, Group B streptococcus, is present. Your caregiver will explain this further. Your caregiver may ask you:  What your birth plan is.  How you are feeling.  If you are feeling the baby move.  If you have had any abnormal symptoms, such as leaking fluid, bleeding, severe headaches, or abdominal cramping.  If you have any questions. Other tests or screenings that may be performed during your third trimester include:  Blood tests that check for low iron levels (anemia).  Fetal testing to check the health, activity level, and growth of the fetus. Testing is done if you have certain medical conditions or if there are problems during the pregnancy. FALSE LABOR You may feel small, irregular contractions that   eventually go away. These are called Braxton Hicks contractions, or false labor. Contractions may last for hours, days, or even weeks before true labor sets in. If contractions come at regular intervals, intensify, or become painful, it is best to be seen by your caregiver.  SIGNS OF LABOR   Menstrual-like cramps.  Contractions that are 5 minutes apart or less.  Contractions that start on the top of the uterus and spread down to the lower abdomen and back.  A sense of increased pelvic pressure or back  pain.  A watery or bloody mucus discharge that comes from the vagina. If you have any of these signs before the 37th week of pregnancy, call your caregiver right away. You need to go to the hospital to get checked immediately. HOME CARE INSTRUCTIONS   Avoid all smoking, herbs, alcohol, and unprescribed drugs. These chemicals affect the formation and growth of the baby.  Follow your caregiver's instructions regarding medicine use. There are medicines that are either safe or unsafe to take during pregnancy.  Exercise only as directed by your caregiver. Experiencing uterine cramps is a good sign to stop exercising.  Continue to eat regular, healthy meals.  Wear a good support bra for breast tenderness.  Do not use hot tubs, steam rooms, or saunas.  Wear your seat belt at all times when driving.  Avoid raw meat, uncooked cheese, cat litter boxes, and soil used by cats. These carry germs that can cause birth defects in the baby.  Take your prenatal vitamins.  Try taking a stool softener (if your caregiver approves) if you develop constipation. Eat more high-fiber foods, such as fresh vegetables or fruit and whole grains. Drink plenty of fluids to keep your urine clear or pale yellow.  Take warm sitz baths to soothe any pain or discomfort caused by hemorrhoids. Use hemorrhoid cream if your caregiver approves.  If you develop varicose veins, wear support hose. Elevate your feet for 15 minutes, 3-4 times a day. Limit salt in your diet.  Avoid heavy lifting, wear low heal shoes, and practice good posture.  Rest a lot with your legs elevated if you have leg cramps or low back pain.  Visit your dentist if you have not gone during your pregnancy. Use a soft toothbrush to brush your teeth and be gentle when you floss.  A sexual relationship may be continued unless your caregiver directs you otherwise.  Do not travel far distances unless it is absolutely necessary and only with the approval  of your caregiver.  Take prenatal classes to understand, practice, and ask questions about the labor and delivery.  Make a trial run to the hospital.  Pack your hospital bag.  Prepare the baby's nursery.  Continue to go to all your prenatal visits as directed by your caregiver. SEEK MEDICAL CARE IF:  You are unsure if you are in labor or if your water has broken.  You have dizziness.  You have mild pelvic cramps, pelvic pressure, or nagging pain in your abdominal area.  You have persistent nausea, vomiting, or diarrhea.  You have a bad smelling vaginal discharge.  You have pain with urination. SEEK IMMEDIATE MEDICAL CARE IF:   You have a fever.  You are leaking fluid from your vagina.  You have spotting or bleeding from your vagina.  You have severe abdominal cramping or pain.  You have rapid weight loss or gain.  You have shortness of breath with chest pain.  You notice sudden or extreme swelling   of your face, hands, ankles, feet, or legs.  You have not felt your baby move in over an hour.  You have severe headaches that do not go away with medicine.  You have vision changes. Document Released: 09/11/2001 Document Revised: 09/22/2013 Document Reviewed: 11/18/2012 [employer] Patient Information 2015 [employer]. This information is not intended to replace advice given to you by your health care provider. Make sure you discuss any questions you have with your health care provider.  

## 2014-05-31 NOTE — Progress Notes (Signed)
Notified pt's cervical exam unchanged, efm tracing reactive, orders to d/c home with labor instructions.

## 2014-05-31 NOTE — MAU Note (Signed)
Pt states u/c's since 9 am. Denies bleeding or lof. Was closed in office

## 2014-05-31 NOTE — MAU Note (Signed)
Urine in lab 

## 2014-06-01 ENCOUNTER — Ambulatory Visit (INDEPENDENT_AMBULATORY_CARE_PROVIDER_SITE_OTHER): Payer: BC Managed Care – PPO | Admitting: Obstetrics

## 2014-06-01 VITALS — BP 131/85 | HR 95

## 2014-06-01 DIAGNOSIS — R52 Pain, unspecified: Secondary | ICD-10-CM

## 2014-06-01 DIAGNOSIS — Z3403 Encounter for supervision of normal first pregnancy, third trimester: Secondary | ICD-10-CM

## 2014-06-01 DIAGNOSIS — Z3400 Encounter for supervision of normal first pregnancy, unspecified trimester: Secondary | ICD-10-CM

## 2014-06-01 LAB — POCT URINALYSIS DIPSTICK
Bilirubin, UA: NEGATIVE
Blood, UA: 50
GLUCOSE UA: NEGATIVE
Nitrite, UA: NEGATIVE
SPEC GRAV UA: 1.015
UROBILINOGEN UA: NEGATIVE
pH, UA: 6

## 2014-06-01 MED ORDER — OXYCODONE HCL 10 MG PO TABS: 10.0000 mg | ORAL_TABLET | Freq: Four times a day (QID) | ORAL | Status: DC | PRN

## 2014-06-01 NOTE — Progress Notes (Signed)
Patient came to office stating she had been having contractions all night- patient checked and put on NST for monitoring

## 2014-06-02 ENCOUNTER — Encounter: Payer: BC Managed Care – PPO | Admitting: Obstetrics

## 2014-06-02 ENCOUNTER — Inpatient Hospital Stay (HOSPITAL_COMMUNITY): Payer: BC Managed Care – PPO

## 2014-06-02 ENCOUNTER — Encounter (HOSPITAL_COMMUNITY): Payer: Self-pay | Admitting: *Deleted

## 2014-06-02 ENCOUNTER — Inpatient Hospital Stay (HOSPITAL_COMMUNITY)
Admission: AD | Admit: 2014-06-02 | Discharge: 2014-06-05 | DRG: 765 | Disposition: A | Discharge status: Home / Self Care | Payer: BC Managed Care – PPO | Source: Ambulatory Visit | Attending: Obstetrics & Gynecology | Admitting: Obstetrics & Gynecology

## 2014-06-02 ENCOUNTER — Inpatient Hospital Stay (HOSPITAL_COMMUNITY): Payer: BC Managed Care – PPO | Admitting: Anesthesiology

## 2014-06-02 ENCOUNTER — Encounter: Payer: Self-pay | Admitting: Obstetrics

## 2014-06-02 ENCOUNTER — Inpatient Hospital Stay (HOSPITAL_COMMUNITY)
Admission: AD | Admit: 2014-06-02 | Discharge: 2014-06-02 | Disposition: A | Discharge status: Home / Self Care | Payer: BC Managed Care – PPO | Source: Ambulatory Visit | Attending: Obstetrics & Gynecology | Admitting: Obstetrics & Gynecology

## 2014-06-02 ENCOUNTER — Inpatient Hospital Stay (HOSPITAL_COMMUNITY)
Admission: AD | Admit: 2014-06-02 | Discharge: 2014-06-02 | Disposition: A | Discharge status: Home / Self Care | Payer: BC Managed Care – PPO | Source: Ambulatory Visit | Attending: Obstetrics | Admitting: Obstetrics

## 2014-06-02 ENCOUNTER — Encounter (HOSPITAL_COMMUNITY): Payer: BC Managed Care – PPO | Admitting: Anesthesiology

## 2014-06-02 DIAGNOSIS — D649 Anemia, unspecified: Secondary | ICD-10-CM | POA: Diagnosis present

## 2014-06-02 DIAGNOSIS — Z8249 Family history of ischemic heart disease and other diseases of the circulatory system: Secondary | ICD-10-CM | POA: Diagnosis not present

## 2014-06-02 DIAGNOSIS — O99892 Other specified diseases and conditions complicating childbirth: Secondary | ICD-10-CM | POA: Diagnosis present

## 2014-06-02 DIAGNOSIS — O139 Gestational [pregnancy-induced] hypertension without significant proteinuria, unspecified trimester: Secondary | ICD-10-CM | POA: Diagnosis present

## 2014-06-02 DIAGNOSIS — Z22330 Carrier of Group B streptococcus: Secondary | ICD-10-CM

## 2014-06-02 DIAGNOSIS — O26893 Other specified pregnancy related conditions, third trimester: Principal | ICD-10-CM

## 2014-06-02 DIAGNOSIS — O288 Other abnormal findings on antenatal screening of mother: Secondary | ICD-10-CM

## 2014-06-02 DIAGNOSIS — O289 Unspecified abnormal findings on antenatal screening of mother: Secondary | ICD-10-CM

## 2014-06-02 DIAGNOSIS — O479 False labor, unspecified: Secondary | ICD-10-CM | POA: Diagnosis present

## 2014-06-02 DIAGNOSIS — O9902 Anemia complicating childbirth: Secondary | ICD-10-CM | POA: Diagnosis present

## 2014-06-02 DIAGNOSIS — O9989 Other specified diseases and conditions complicating pregnancy, childbirth and the puerperium: Secondary | ICD-10-CM

## 2014-06-02 DIAGNOSIS — N898 Other specified noninflammatory disorders of vagina: Secondary | ICD-10-CM

## 2014-06-02 DIAGNOSIS — O411290 Chorioamnionitis, unspecified trimester, not applicable or unspecified: Secondary | ICD-10-CM | POA: Diagnosis not present

## 2014-06-02 LAB — COMPREHENSIVE METABOLIC PANEL
ALBUMIN: 2.9 g/dL — AB (ref 3.5–5.2)
ALK PHOS: 341 U/L — AB (ref 39–117)
ALT: 16 U/L (ref 0–35)
ANION GAP: 15 (ref 5–15)
AST: 19 U/L (ref 0–37)
BILIRUBIN TOTAL: 0.3 mg/dL (ref 0.3–1.2)
BUN: 4 mg/dL — AB (ref 6–23)
CHLORIDE: 100 meq/L (ref 96–112)
CO2: 19 mEq/L (ref 19–32)
CREATININE: 0.59 mg/dL (ref 0.50–1.10)
Calcium: 9 mg/dL (ref 8.4–10.5)
GFR calc non Af Amer: >90 mL/min (ref >90)
GLUCOSE: 97 mg/dL (ref 70–99)
Potassium: 3.6 mEq/L — ABNORMAL LOW (ref 3.7–5.3)
Sodium: 134 mEq/L — ABNORMAL LOW (ref 137–147)
Total Protein: 6.9 g/dL (ref 6.0–8.3)

## 2014-06-02 LAB — CBC
HCT: 32.8 % — ABNORMAL LOW (ref 36.0–46.0)
HEMOGLOBIN: 10.9 g/dL — AB (ref 12.0–15.0)
MCH: 30.6 pg (ref 26.0–34.0)
MCHC: 33.2 g/dL (ref 30.0–36.0)
MCV: 92.1 fL (ref 78.0–100.0)
PLATELETS: 397 10*3/uL (ref 150–400)
RBC: 3.56 MIL/uL — AB (ref 3.87–5.11)
RDW: 15 % (ref 11.5–15.5)
WBC: 12.5 10*3/uL — AB (ref 4.0–10.5)

## 2014-06-02 LAB — URIC ACID: Uric Acid, Serum: 5.5 mg/dL (ref 2.4–7.0)

## 2014-06-02 LAB — LACTATE DEHYDROGENASE: LDH: 250 U/L (ref 94–250)

## 2014-06-02 LAB — AMNISURE RUPTURE OF MEMBRANE (ROM) NOT AT ARMC: Amnisure ROM: NEGATIVE

## 2014-06-02 MED ORDER — DIPHENHYDRAMINE HCL 50 MG/ML IJ SOLN: 12.5000 mg | INTRAMUSCULAR | Status: DC | PRN

## 2014-06-02 MED ORDER — EPHEDRINE 5 MG/ML INJ: 10.0000 mg | INTRAVENOUS | Status: DC | PRN

## 2014-06-02 MED ORDER — CITRIC ACID-SODIUM CITRATE 334-500 MG/5ML PO SOLN
30.0000 mL | ORAL | Status: DC | PRN
  Administered 2014-06-03 (×2): 30 mL via ORAL
  Filled 2014-06-02: qty 15

## 2014-06-02 MED ORDER — FENTANYL 2.5 MCG/ML BUPIVACAINE 1/10 % EPIDURAL INFUSION (WH - ANES)
INTRAMUSCULAR | Status: DC | PRN
  Administered 2014-06-02: 14 mL/h via EPIDURAL

## 2014-06-02 MED ORDER — ACETAMINOPHEN 325 MG PO TABS
650.0000 mg | ORAL_TABLET | ORAL | Status: DC | PRN
Start: 2014-06-02 — End: 2014-06-03
  Filled 2014-06-02: qty 2

## 2014-06-02 MED ORDER — OXYCODONE-ACETAMINOPHEN 5-325 MG PO TABS: 1.0000 | ORAL_TABLET | ORAL | Status: DC | PRN

## 2014-06-02 MED ORDER — FENTANYL 2.5 MCG/ML BUPIVACAINE 1/10 % EPIDURAL INFUSION (WH - ANES): 14.0000 mL/h | INTRAMUSCULAR | Status: DC | PRN

## 2014-06-02 MED ORDER — LACTATED RINGERS IV SOLN
INTRAVENOUS | Status: DC
  Administered 2014-06-02: 20:00:00 via INTRAVENOUS

## 2014-06-02 MED ORDER — PHENYLEPHRINE 40 MCG/ML (10ML) SYRINGE FOR IV PUSH (FOR BLOOD PRESSURE SUPPORT): 80.0000 ug | PREFILLED_SYRINGE | INTRAVENOUS | Status: DC | PRN

## 2014-06-02 MED ORDER — FLEET ENEMA 7-19 GM/118ML RE ENEM: 1.0000 | ENEMA | RECTAL | Status: DC | PRN

## 2014-06-02 MED ORDER — PHENYLEPHRINE 40 MCG/ML (10ML) SYRINGE FOR IV PUSH (FOR BLOOD PRESSURE SUPPORT)
80.0000 ug | PREFILLED_SYRINGE | INTRAVENOUS | Status: DC | PRN
  Filled 2014-06-02: qty 10

## 2014-06-02 MED ORDER — OXYTOCIN BOLUS FROM INFUSION: 500.0000 mL | INTRAVENOUS | Status: DC

## 2014-06-02 MED ORDER — LIDOCAINE HCL (PF) 1 % IJ SOLN
INTRAMUSCULAR | Status: DC | PRN
  Administered 2014-06-02 (×2): 4 mL

## 2014-06-02 MED ORDER — OXYTOCIN 40 UNITS IN LACTATED RINGERS INFUSION - SIMPLE MED: 62.5000 mL/h | INTRAVENOUS | Status: DC

## 2014-06-02 MED ORDER — LACTATED RINGERS IV SOLN: 500.0000 mL | INTRAVENOUS | Status: DC | PRN

## 2014-06-02 MED ORDER — PENICILLIN G POTASSIUM 5000000 UNITS IJ SOLR
2.5000 10*6.[IU] | INTRAVENOUS | Status: DC
  Administered 2014-06-02: 2.5 10*6.[IU] via INTRAVENOUS
  Filled 2014-06-02 (×5): qty 2.5

## 2014-06-02 MED ORDER — OXYCODONE-ACETAMINOPHEN 5-325 MG PO TABS: 2.0000 | ORAL_TABLET | ORAL | Status: DC | PRN

## 2014-06-02 MED ORDER — PENICILLIN G POTASSIUM 5000000 UNITS IJ SOLR
5.0000 10*6.[IU] | Freq: Once | INTRAVENOUS | Status: AC
  Administered 2014-06-02: 5 10*6.[IU] via INTRAVENOUS
  Filled 2014-06-02: qty 5

## 2014-06-02 MED ORDER — LIDOCAINE HCL (PF) 1 % IJ SOLN: 30.0000 mL | INTRAMUSCULAR | Status: DC | PRN

## 2014-06-02 MED ORDER — ONDANSETRON HCL 4 MG/2ML IJ SOLN: 4.0000 mg | Freq: Four times a day (QID) | INTRAMUSCULAR | Status: DC | PRN

## 2014-06-02 MED ORDER — LACTATED RINGERS IV SOLN: INTRAVENOUS | Status: DC

## 2014-06-02 MED ORDER — FENTANYL 2.5 MCG/ML BUPIVACAINE 1/10 % EPIDURAL INFUSION (WH - ANES)
14.0000 mL/h | INTRAMUSCULAR | Status: DC | PRN
  Administered 2014-06-02: 14 mL/h via EPIDURAL
  Filled 2014-06-02: qty 125

## 2014-06-02 MED ORDER — LACTATED RINGERS IV SOLN: 500.0000 mL | Freq: Once | INTRAVENOUS | Status: DC

## 2014-06-02 NOTE — MAU Note (Signed)
Report called to [facility] charge nurse. Will go to [phone]

## 2014-06-02 NOTE — Anesthesia Preprocedure Evaluation (Signed)
Anesthesia Evaluation  Patient identified by MRN, date of birth, ID band Patient awake    Reviewed: Allergy & Precautions, H&P , NPO status , Patient's Chart, lab work & pertinent test results  Airway Mallampati: II TM Distance: >3 FB Neck ROM: Full    Dental no notable dental hx.    Pulmonary neg pulmonary ROS,  breath sounds clear to auscultation  Pulmonary exam normal       Cardiovascular negative cardio ROS  Rhythm:Regular Rate:Normal     Neuro/Psych negative neurological ROS  negative psych ROS   GI/Hepatic negative GI ROS, Neg liver ROS,   Endo/Other  negative endocrine ROS  Renal/GU negative Renal ROS     Musculoskeletal negative musculoskeletal ROS (+)   Abdominal   Peds  Hematology negative hematology ROS (+)   Anesthesia Other Findings   Reproductive/Obstetrics (+) Pregnancy                           Anesthesia Physical Anesthesia Plan  ASA: II  Anesthesia Plan: Epidural   Post-op Pain Management:    Induction:   Airway Management Planned:   Additional Equipment:   Intra-op Plan:   Post-operative Plan:   Informed Consent: I have reviewed the patients History and Physical, chart, labs and discussed the procedure including the risks, benefits and alternatives for the proposed anesthesia with the patient or authorized representative who has indicated his/her understanding and acceptance.     Plan Discussed with:   Anesthesia Plan Comments:         Anesthesia Quick Evaluation  

## 2014-06-02 NOTE — Anesthesia Procedure Notes (Signed)
Epidural Patient location during procedure: OB  Staffing Anesthesiologist: Jaimes Eckert R Performed by: anesthesiologist   Preanesthetic Checklist Completed: patient identified, pre-op evaluation, timeout performed, IV checked, risks and benefits discussed and monitors and equipment checked  Epidural Patient position: sitting Prep: site prepped and draped and DuraPrep Patient monitoring: heart rate Approach: midline Location: L3-L4 Injection technique: LOR air and LOR saline  Needle:  Needle type: Tuohy  Needle gauge: 17 G Needle length: 9 cm Needle insertion depth: 5 cm Catheter type: closed end flexible Catheter size: 19 Gauge Catheter at skin depth: 11 cm Test dose: negative  Assessment Sensory level: T8 Events: blood not aspirated, injection not painful, no injection resistance, negative IV test and no paresthesia  Additional Notes Reason for block:procedure for pain   

## 2014-06-02 NOTE — MAU Note (Signed)
Patient presents to MAU with c/o frequent contractions that have gotten stronger. Denies LOF or VB. +FM. Was checked in MAU earlier today; states was 1cm

## 2014-06-02 NOTE — Discharge Instructions (Signed)
Third Trimester of Pregnancy The third trimester is from week 29 through week 42, months 7 through 9. This trimester is when your unborn baby (fetus) is growing very fast. At the end of the ninth month, the unborn baby is about 20 inches in length. It weighs about 6-10 pounds.  HOME CARE   Avoid all smoking, herbs, and alcohol. Avoid drugs not approved by your doctor.  Only take medicine as told by your doctor. Some medicines are safe and some are not during pregnancy.  Exercise only as told by your doctor. Stop exercising if you start having cramps.  Eat regular, healthy meals.  Wear a good support bra if your breasts are tender.  Do not use hot tubs, steam rooms, or saunas.  Wear your seat belt when driving.  Avoid raw meat, uncooked cheese, and liter boxes and soil used by cats.  Take your prenatal vitamins.  Try taking medicine that helps you poop (stool softener) as needed, and if your doctor approves. Eat more fiber by eating fresh fruit, vegetables, and whole grains. Drink enough fluids to keep your pee (urine) clear or pale yellow.  Take warm water baths (sitz baths) to soothe pain or discomfort caused by hemorrhoids. Use hemorrhoid cream if your doctor approves.  If you have puffy, bulging veins (varicose veins), wear support hose. Raise (elevate) your feet for 15 minutes, 3-4 times a day. Limit salt in your diet.  Avoid heavy lifting, wear low heels, and sit up straight.  Rest with your legs raised if you have leg cramps or low back pain.  Visit your dentist if you have not gone during your pregnancy. Use a soft toothbrush to brush your teeth. Be gentle when you floss.  You can have sex (intercourse) unless your doctor tells you not to.  Do not travel far distances unless you must. Only do so with your doctor's approval.  Take prenatal classes.  Practice driving to the hospital.  Pack your hospital bag.  Prepare the baby's room.  Go to your doctor visits. GET  HELP IF:  You are not sure if you are in labor or if your water has broken.  You are dizzy.  You have mild cramps or pressure in your lower belly (abdominal).  You have a nagging pain in your belly area.  You continue to feel sick to your stomach (nauseous), throw up (vomit), or have watery poop (diarrhea).  You have bad smelling fluid coming from your vagina.  You have pain with peeing (urination). GET HELP RIGHT AWAY IF:   You have a fever.  You are leaking fluid from your vagina.  You are spotting or bleeding from your vagina.  You have severe belly cramping or pain.  You lose or gain weight rapidly.  You have trouble catching your breath and have chest pain.  You notice sudden or extreme puffiness (swelling) of your face, hands, ankles, feet, or legs.  You have not felt the baby move in over an hour.  You have severe headaches that do not go away with medicine.  You have vision changes. Document Released: 12/12/2009 Document Revised: 01/12/2013 Document Reviewed: 11/18/2012 [employer] Patient Information 2015 [employer]. This information is not intended to replace advice given to you by your health care provider. Make sure you discuss any questions you have with your health care provider.  

## 2014-06-02 NOTE — MAU Note (Signed)
Pt states that she has been having contractions for the past two days. Denies SROM, states positive fetal movement, denies vaginal bleeding. Denies vomiting/diarrhea.

## 2014-06-02 NOTE — MAU Note (Signed)
Pt reprots she had a gush of fluid about 15 min ago.  Clear fluid out. Denies any cramping or ctx at his time.  Good fetal movement reported

## 2014-06-02 NOTE — MAU Provider Note (Signed)
Subjective:  Ms.Natonya S Bently is a 28 y.o. female G4P0030 at [redacted]w[redacted]d who presents with ?ROM; patient states she felt a gush of fluid at 0945 this morning and she has continued to have leaking of fluid since. +fetal movement    Objective:  GENERAL: Well-developed, well-nourished female in no acute distress.  HEENT: Normocephalic, atraumatic.   LUNGS: Effort normal HEART: Regular rate  SKIN: Warm, dry and without erythema PSYCH: Normal mood and affect  Filed Vitals:   06/02/14 1043 06/02/14 1057  BP:  131/83  Pulse:  120  Resp:  18  Height:  (1.651 m)  (1.651 m)  Weight: 81.647 kg (180 lb) 81.647 kg (180 lb)   Results for orders placed during the hospital encounter of 06/02/14 (from the past 48 hour(s))  AMNISURE RUPTURE OF MEMBRANE (ROM)     Status: None   Collection Time    06/02/14 11:41 AM      Result Value Ref Range   Amnisure ROM NEGATIVE     Dilation: 1 Effacement (%): 90 Cervical Position: Posterior Station: -3 Presentation: Vertex Exam by:: Morrison Old RN  MDM: NST Cervical exam by RN Amnisure.     Assessment:  1. Vaginal discharge during pregnancy in third trimester : Ani sure negative for ROM     Plan:  Discharge home in stable condition Labor precautions discussed Return to MAU as needed Kick counts  Iona Hansen Keontae Levingston, NP 06/02/2014 11:35 AM

## 2014-06-02 NOTE — MAU Note (Signed)
?  SROM @ 0945 this AM; GBS +; G4P0;

## 2014-06-02 NOTE — H&P (Signed)
Andrea Saunders is Saunders 28 y.o. female presenting with regular contractions. Maternal Medical History:  Reason for admission: Contractions.   Contractions: Onset was 6-12 hours ago.   Frequency: regular.   Perceived severity is strong.    Fetal activity: Perceived fetal activity is normal.    Prenatal complications: no prenatal complications Prenatal Complications - Diabetes: none.    OB History   Grav Para Term Preterm Abortions TAB SAB Ect Mult Living   Past Medical History  Diagnosis Date  . History of miscarriage   . Abortion     x 2   Past Surgical History  Procedure Laterality Date  . Induced abortion      x 2  . Wisdom tooth extraction     Family History: family history includes Hypertension in her mother and sister. Social History:  reports that she has never smoked. She has never used smokeless tobacco. She reports that she does not drink alcohol or use illicit drugs.     Review of Systems  Constitutional: Negative for fever.  Eyes: Negative for blurred vision.  Respiratory: Negative for shortness of breath.   Gastrointestinal: Negative for vomiting.  Skin: Negative for rash.  Neurological: Negative for headaches.    Dilation: 3 Effacement (%): 100 Station: -2 Exam by:: Andrea Hint RN Blood pressure 138/83, pulse 111, temperature 98.3 F (36.8 C), temperature source Oral, resp. rate 18, last menstrual period 08/27/2013. Maternal Exam:  Uterine Assessment: Contraction frequency is regular.   Abdomen: not evaluated.  Introitus: not evaluated.   Cervix: Cervix evaluated by digital exam.     Fetal Exam Fetal Monitor Review: Baseline rate: 140.  Variability: moderate (6-25 bpm).   Pattern: accelerations present and early decelerations.    Fetal State Assessment: Category I - tracings are normal.     Physical Exam  Constitutional: She appears well-developed.  HENT:  Head: Normocephalic.  Neck: Neck supple. No thyromegaly  present.  Cardiovascular: Normal rate and regular rhythm.   Respiratory: Breath sounds normal.  GI: Soft. Bowel sounds are normal.  Skin: No rash noted.    Prenatal labs: ABO, Rh: O/POS/-- (02/11 1336) Antibody: NEG (02/11 1336) Rubella: 3.65 (02/11 1336) RPR: NON REAC (06/08 1341)  HBsAg: NEGATIVE (02/11 1336)  HIV: NONREACTIVE (06/08 1341)  GBS: Detected (07/30 1645)   Assessment/Plan: Primigravida at [redacted]w[redacted]d.  Early labor.  GBS pos.  Category I FHT  Admit PCN for GBS prophylaxis Anticipate an NSVD   Andrea Saunders 06/02/2014, 6:12 PM

## 2014-06-02 NOTE — Progress Notes (Signed)
Subjective:    Andrea Saunders is a 28 y.o. female being seen today for her obstetrical visit. She is at [redacted]w[redacted]d gestation. Patient reports contractions since last night. Fetal movement: normal.  Problem List Items Addressed This Visit   None    Visit Diagnoses   Encounter for supervision of normal first pregnancy in third trimester    -  Primary    Relevant Orders       POCT urinalysis dipstick (Completed)       POCT urinalysis dipstick    Pain        Relevant Medications       Oxycodone HCl 10 MG TABS    Other Relevant Orders       Fetal non-stress test      Patient Active Problem List   Diagnosis Date Noted  . Nausea and vomiting in pregnancy prior to [redacted] weeks gestation 10/14/2013  . BV (bacterial vaginosis) 09/10/2013  . 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 Vaginitis and vulvovaginitis, unspecified 02/20/2013  . Abdominal pain in female patient 02/20/2013    Objective:    BP 131/85  Pulse 95  LMP 08/27/2013 FHT: 150 BPM  Uterine Size: size equals dates  Presentations: cephalic  Pelvic Exam:              Dilation: 2cm       Effacement: 75%             Station:  -1    Consistency: soft            Position: posterior     Assessment:    Pregnancy @ [redacted]w[redacted]d weeks   Plan:   Plans for delivery: Vaginal anticipated; labs reviewed; problem list updated Counseling: Consent signed. Infant feeding: plans to breastfeed. Cigarette smoking: never smoked. L&D discussion: symptoms of labor, discussed when to call, discussed what number to call, anesthetic/analgesic options reviewed and delivering clinician:  plans Physician. Postpartum supports and preparation: circumcision discussed and contraception plans discussed.  Follow up in 1 Week.

## 2014-06-02 NOTE — Progress Notes (Signed)
Report given to Paschal RN

## 2014-06-03 ENCOUNTER — Encounter (HOSPITAL_COMMUNITY): Payer: Self-pay | Admitting: Anesthesiology

## 2014-06-03 ENCOUNTER — Encounter (HOSPITAL_COMMUNITY)
Admission: AD | Disposition: A | Discharge status: Home / Self Care | Payer: Self-pay | Source: Ambulatory Visit | Attending: Obstetrics & Gynecology

## 2014-06-03 DIAGNOSIS — O411290 Chorioamnionitis, unspecified trimester, not applicable or unspecified: Secondary | ICD-10-CM | POA: Diagnosis not present

## 2014-06-03 DIAGNOSIS — O139 Gestational [pregnancy-induced] hypertension without significant proteinuria, unspecified trimester: Secondary | ICD-10-CM | POA: Diagnosis present

## 2014-06-03 LAB — RPR

## 2014-06-03 SURGERY — Surgical Case
Anesthesia: Epidural | Site: Abdomen

## 2014-06-03 MED ORDER — MAGNESIUM HYDROXIDE 400 MG/5ML PO SUSP: 30.0000 mL | ORAL | Status: DC | PRN

## 2014-06-03 MED ORDER — SCOPOLAMINE 1 MG/3DAYS TD PT72: 1.0000 | MEDICATED_PATCH | Freq: Once | TRANSDERMAL | Status: DC

## 2014-06-03 MED ORDER — SENNOSIDES-DOCUSATE SODIUM 8.6-50 MG PO TABS
2.0000 | ORAL_TABLET | ORAL | Status: DC
  Administered 2014-06-03 – 2014-06-04 (×2): 2 via ORAL
  Filled 2014-06-03 (×2): qty 2

## 2014-06-03 MED ORDER — GENTAMICIN SULFATE 40 MG/ML IJ SOLN
Freq: Once | INTRAVENOUS | Status: AC
  Administered 2014-06-03: 03:00:00 via INTRAVENOUS
  Filled 2014-06-03: qty 4.5

## 2014-06-03 MED ORDER — OXYTOCIN 40 UNITS IN LACTATED RINGERS INFUSION - SIMPLE MED: 62.5000 mL/h | INTRAVENOUS | Status: AC

## 2014-06-03 MED ORDER — PRENATAL MULTIVITAMIN CH
1.0000 | ORAL_TABLET | Freq: Every day | ORAL | Status: DC
  Administered 2014-06-03 – 2014-06-04 (×3): 1 via ORAL
  Filled 2014-06-03 (×2): qty 1

## 2014-06-03 MED ORDER — MEPERIDINE HCL 25 MG/ML IJ SOLN: 6.2500 mg | INTRAMUSCULAR | Status: DC | PRN

## 2014-06-03 MED ORDER — ONDANSETRON HCL 4 MG/2ML IJ SOLN: 4.0000 mg | Freq: Three times a day (TID) | INTRAMUSCULAR | Status: DC | PRN

## 2014-06-03 MED ORDER — LACTATED RINGERS IV SOLN
INTRAVENOUS | Status: DC | PRN
  Administered 2014-06-03: 04:00:00 via INTRAVENOUS

## 2014-06-03 MED ORDER — NALBUPHINE HCL 10 MG/ML IJ SOLN: 5.0000 mg | INTRAMUSCULAR | Status: DC | PRN

## 2014-06-03 MED ORDER — CLINDAMYCIN PHOSPHATE 900 MG/50ML IV SOLN: 900.0000 mg | Freq: Once | INTRAVENOUS | Status: DC

## 2014-06-03 MED ORDER — LANOLIN HYDROUS EX OINT
1.0000 | TOPICAL_OINTMENT | CUTANEOUS | Status: DC | PRN
Start: 2014-06-03 — End: 2014-06-05

## 2014-06-03 MED ORDER — KETOROLAC TROMETHAMINE 30 MG/ML IJ SOLN: 30.0000 mg | Freq: Four times a day (QID) | INTRAMUSCULAR | Status: AC | PRN

## 2014-06-03 MED ORDER — FERROUS SULFATE 325 (65 FE) MG PO TABS
325.0000 mg | ORAL_TABLET | Freq: Two times a day (BID) | ORAL | Status: DC
  Administered 2014-06-03: 325 mg via ORAL
  Filled 2014-06-03: qty 1

## 2014-06-03 MED ORDER — DIPHENHYDRAMINE HCL 50 MG/ML IJ SOLN: 25.0000 mg | INTRAMUSCULAR | Status: DC | PRN

## 2014-06-03 MED ORDER — OXYCODONE-ACETAMINOPHEN 5-325 MG PO TABS
1.0000 | ORAL_TABLET | ORAL | Status: DC | PRN
  Administered 2014-06-03 – 2014-06-04 (×5): 1 via ORAL
  Filled 2014-06-03 (×5): qty 1

## 2014-06-03 MED ORDER — DIPHENHYDRAMINE HCL 25 MG PO CAPS: 25.0000 mg | ORAL_CAPSULE | Freq: Four times a day (QID) | ORAL | Status: DC | PRN

## 2014-06-03 MED ORDER — DIBUCAINE 1 % RE OINT: 1.0000 "application " | TOPICAL_OINTMENT | RECTAL | Status: DC | PRN

## 2014-06-03 MED ORDER — MEASLES, MUMPS & RUBELLA VAC ~~LOC~~ INJ
0.5000 mL | INJECTION | Freq: Once | SUBCUTANEOUS | Status: DC
  Filled 2014-06-03: qty 0.5

## 2014-06-03 MED ORDER — OXYCODONE-ACETAMINOPHEN 5-325 MG PO TABS: 2.0000 | ORAL_TABLET | ORAL | Status: DC | PRN

## 2014-06-03 MED ORDER — DIPHENHYDRAMINE HCL 50 MG/ML IJ SOLN: 12.5000 mg | INTRAMUSCULAR | Status: DC | PRN

## 2014-06-03 MED ORDER — HYDROMORPHONE HCL PF 1 MG/ML IJ SOLN
INTRAMUSCULAR | Status: AC
  Administered 2014-06-03: 0.5 mg via INTRAVENOUS
  Filled 2014-06-03: qty 1

## 2014-06-03 MED ORDER — MORPHINE SULFATE 0.5 MG/ML IJ SOLN
INTRAMUSCULAR | Status: AC
Start: 2014-06-03 — End: 2014-06-03
  Filled 2014-06-03: qty 10

## 2014-06-03 MED ORDER — OXYCODONE HCL 5 MG/5ML PO SOLN: 5.0000 mg | Freq: Once | ORAL | Status: DC | PRN

## 2014-06-03 MED ORDER — ONDANSETRON HCL 4 MG/2ML IJ SOLN: 4.0000 mg | INTRAMUSCULAR | Status: DC | PRN

## 2014-06-03 MED ORDER — DIPHENHYDRAMINE HCL 25 MG PO CAPS
25.0000 mg | ORAL_CAPSULE | ORAL | Status: DC | PRN
Start: 2014-06-03 — End: 2014-06-05

## 2014-06-03 MED ORDER — MORPHINE SULFATE (PF) 0.5 MG/ML IJ SOLN
INTRAMUSCULAR | Status: DC | PRN
  Administered 2014-06-03: 3 mg via EPIDURAL
  Administered 2014-06-03: 2 mg via INTRAVENOUS

## 2014-06-03 MED ORDER — ZOLPIDEM TARTRATE 5 MG PO TABS: 5.0000 mg | ORAL_TABLET | Freq: Every evening | ORAL | Status: DC | PRN

## 2014-06-03 MED ORDER — TERBUTALINE SULFATE 1 MG/ML IJ SOLN
INTRAMUSCULAR | Status: AC
  Filled 2014-06-03: qty 1

## 2014-06-03 MED ORDER — SODIUM BICARBONATE 8.4 % IV SOLN
INTRAVENOUS | Status: DC | PRN
  Administered 2014-06-03: 10 mL via EPIDURAL

## 2014-06-03 MED ORDER — OXYCODONE HCL 5 MG PO TABS: 5.0000 mg | ORAL_TABLET | Freq: Once | ORAL | Status: DC | PRN

## 2014-06-03 MED ORDER — KETOROLAC TROMETHAMINE 60 MG/2ML IM SOLN
60.0000 mg | Freq: Once | INTRAMUSCULAR | Status: AC | PRN
  Administered 2014-06-03: 60 mg via INTRAMUSCULAR

## 2014-06-03 MED ORDER — OXYTOCIN 10 UNIT/ML IJ SOLN
INTRAMUSCULAR | Status: AC
  Filled 2014-06-03: qty 4

## 2014-06-03 MED ORDER — PROMETHAZINE HCL 25 MG/ML IJ SOLN: 6.2500 mg | INTRAMUSCULAR | Status: DC | PRN

## 2014-06-03 MED ORDER — WITCH HAZEL-GLYCERIN EX PADS: 1.0000 "application " | MEDICATED_PAD | CUTANEOUS | Status: DC | PRN

## 2014-06-03 MED ORDER — LACTATED RINGERS IV SOLN
INTRAVENOUS | Status: DC | PRN
  Administered 2014-06-03 (×2): via INTRAVENOUS

## 2014-06-03 MED ORDER — METOCLOPRAMIDE HCL 5 MG/ML IJ SOLN: 10.0000 mg | Freq: Three times a day (TID) | INTRAMUSCULAR | Status: DC | PRN

## 2014-06-03 MED ORDER — SODIUM BICARBONATE 8.4 % IV SOLN
INTRAVENOUS | Status: AC
  Filled 2014-06-03: qty 50

## 2014-06-03 MED ORDER — LIDOCAINE-EPINEPHRINE (PF) 2 %-1:200000 IJ SOLN
INTRAMUSCULAR | Status: AC
  Filled 2014-06-03: qty 20

## 2014-06-03 MED ORDER — HYDROMORPHONE HCL PF 1 MG/ML IJ SOLN
0.2500 mg | INTRAMUSCULAR | Status: DC | PRN
  Administered 2014-06-03 (×2): 0.5 mg via INTRAVENOUS

## 2014-06-03 MED ORDER — SODIUM CHLORIDE 0.9 % IJ SOLN: 3.0000 mL | INTRAMUSCULAR | Status: DC | PRN

## 2014-06-03 MED ORDER — ONDANSETRON HCL 4 MG PO TABS
4.0000 mg | ORAL_TABLET | ORAL | Status: DC | PRN
Start: 2014-06-03 — End: 2014-06-05

## 2014-06-03 MED ORDER — CLINDAMYCIN PHOSPHATE 900 MG/50ML IV SOLN: 900.0000 mg | Freq: Three times a day (TID) | INTRAVENOUS | Status: DC

## 2014-06-03 MED ORDER — TERBUTALINE SULFATE 1 MG/ML IJ SOLN: 0.2500 mg | Freq: Once | INTRAMUSCULAR | Status: DC

## 2014-06-03 MED ORDER — SCOPOLAMINE 1 MG/3DAYS TD PT72
MEDICATED_PATCH | TRANSDERMAL | Status: AC
  Filled 2014-06-03: qty 1

## 2014-06-03 MED ORDER — HYDROMORPHONE HCL PF 1 MG/ML IJ SOLN: 0.2500 mg | INTRAMUSCULAR | Status: DC | PRN

## 2014-06-03 MED ORDER — ONDANSETRON HCL 4 MG/2ML IJ SOLN
INTRAMUSCULAR | Status: AC
  Filled 2014-06-03: qty 2

## 2014-06-03 MED ORDER — LACTATED RINGERS IV SOLN
INTRAVENOUS | Status: DC
  Administered 2014-06-03: 125 mL/h via INTRAVENOUS

## 2014-06-03 MED ORDER — KETOROLAC TROMETHAMINE 60 MG/2ML IM SOLN
INTRAMUSCULAR | Status: AC
  Administered 2014-06-03: 60 mg via INTRAMUSCULAR
  Filled 2014-06-03: qty 2

## 2014-06-03 MED ORDER — IBUPROFEN 600 MG PO TABS
600.0000 mg | ORAL_TABLET | Freq: Four times a day (QID) | ORAL | Status: DC
  Administered 2014-06-03 – 2014-06-05 (×9): 600 mg via ORAL
  Filled 2014-06-03 (×8): qty 1

## 2014-06-03 MED ORDER — TETANUS-DIPHTH-ACELL PERTUSSIS 5-2.5-18.5 LF-MCG/0.5 IM SUSP: 0.5000 mL | Freq: Once | INTRAMUSCULAR | Status: DC

## 2014-06-03 MED ORDER — OXYTOCIN 10 UNIT/ML IJ SOLN
40.0000 [IU] | INTRAMUSCULAR | Status: DC | PRN
  Administered 2014-06-03: 40 [IU] via INTRAVENOUS

## 2014-06-03 MED ORDER — ONDANSETRON HCL 4 MG/2ML IJ SOLN
INTRAMUSCULAR | Status: DC | PRN
  Administered 2014-06-03: 4 mg via INTRAVENOUS

## 2014-06-03 MED ORDER — NALOXONE HCL 0.4 MG/ML IJ SOLN: 0.4000 mg | INTRAMUSCULAR | Status: DC | PRN

## 2014-06-03 MED ORDER — NALOXONE HCL 1 MG/ML IJ SOLN
1.0000 ug/kg/h | INTRAVENOUS | Status: DC | PRN
  Filled 2014-06-03: qty 2

## 2014-06-03 MED ORDER — GENTAMICIN SULFATE 40 MG/ML IJ SOLN
Freq: Three times a day (TID) | INTRAVENOUS | Status: DC
  Administered 2014-06-03 (×2): via INTRAVENOUS
  Filled 2014-06-03 (×4): qty 4

## 2014-06-03 MED ORDER — SIMETHICONE 80 MG PO CHEW
80.0000 mg | CHEWABLE_TABLET | ORAL | Status: DC
  Filled 2014-06-03 (×2): qty 1

## 2014-06-03 MED ORDER — SIMETHICONE 80 MG PO CHEW
80.0000 mg | CHEWABLE_TABLET | ORAL | Status: DC | PRN
Start: 2014-06-03 — End: 2014-06-05
  Administered 2014-06-03 – 2014-06-04 (×4): 80 mg via ORAL
  Filled 2014-06-03 (×2): qty 1

## 2014-06-03 SURGICAL SUPPLY — 46 items
APL SKNCLS STERI-STRIP NONHPOA (GAUZE/BANDAGES/DRESSINGS) ×1
BENZOIN TINCTURE PRP APPL 2/3 (GAUZE/BANDAGES/DRESSINGS) ×3 IMPLANT
BLADE SURG 10 STRL SS (BLADE) ×6 IMPLANT
CANISTER WOUND CARE 500ML ATS (WOUND CARE) IMPLANT
CLAMP CORD UMBIL (MISCELLANEOUS) IMPLANT
CLOSURE WOUND 1/2 X4 (GAUZE/BANDAGES/DRESSINGS) ×1
CLOTH BEACON ORANGE TIMEOUT ST (SAFETY) ×3 IMPLANT
CONTAINER PREFILL 10% NBF 15ML (MISCELLANEOUS) IMPLANT
DRAPE LG THREE QUARTER DISP (DRAPES) IMPLANT
DRESSING DISP NPWT PICO 4X12 (MISCELLANEOUS) IMPLANT
DRSG OPSITE POSTOP 4X10 (GAUZE/BANDAGES/DRESSINGS) ×3 IMPLANT
DRSG VAC ATS LRG SENSATRAC (GAUZE/BANDAGES/DRESSINGS) IMPLANT
DRSG VAC ATS SM SENSATRAC (GAUZE/BANDAGES/DRESSINGS) IMPLANT
DURAPREP 26ML APPLICATOR (WOUND CARE) ×3 IMPLANT
ELECT REM PT RETURN 9FT ADLT (ELECTROSURGICAL) ×3
ELECTRODE REM PT RTRN 9FT ADLT (ELECTROSURGICAL) ×1 IMPLANT
EXTRACTOR VACUUM M CUP 4 TUBE (SUCTIONS) IMPLANT
EXTRACTOR VACUUM M CUP 4' TUBE (SUCTIONS)
GLOVE BIO SURGEON STRL SZ 6.5 (GLOVE) ×2 IMPLANT
GLOVE BIO SURGEONS STRL SZ 6.5 (GLOVE) ×1
GOWN STRL REUS W/TWL LRG LVL3 (GOWN DISPOSABLE) ×6 IMPLANT
KIT ABG SYR 3ML LUER SLIP (SYRINGE) ×2 IMPLANT
NDL HYPO 25X5/8 SAFETYGLIDE (NEEDLE) ×1 IMPLANT
NEEDLE HYPO 25X5/8 SAFETYGLIDE (NEEDLE) ×3 IMPLANT
NS IRRIG 1000ML POUR BTL (IV SOLUTION) ×3 IMPLANT
PACK C SECTION WH (CUSTOM PROCEDURE TRAY) ×3 IMPLANT
PAD OB MATERNITY 4.3X12.25 (PERSONAL CARE ITEMS) ×3 IMPLANT
RTRCTR C-SECT PINK 25CM LRG (MISCELLANEOUS) ×3 IMPLANT
SCRUB PCMX 4 OZ (MISCELLANEOUS) ×3 IMPLANT
SPONGE GAUZE 4X4 12PLY STER LF (GAUZE/BANDAGES/DRESSINGS) ×2 IMPLANT
STAPLER VISISTAT 35W (STAPLE) IMPLANT
STRIP CLOSURE SKIN 1/2X4 (GAUZE/BANDAGES/DRESSINGS) ×2 IMPLANT
SUT MNCRL 0 VIOLET CTX 36 (SUTURE) ×2 IMPLANT
SUT MNCRL AB 3-0 PS2 27 (SUTURE) ×2 IMPLANT
SUT MONOCRYL 0 CTX 36 (SUTURE) ×4
SUT PDS AB 0 CTX 36 PDP370T (SUTURE) IMPLANT
SUT PLAIN 0 NONE (SUTURE) IMPLANT
SUT VIC AB 0 CTXB 36 (SUTURE) IMPLANT
SUT VIC AB 2-0 CT1 (SUTURE) ×3 IMPLANT
SUT VIC AB 2-0 CT1 27 (SUTURE) ×3
SUT VIC AB 2-0 CT1 TAPERPNT 27 (SUTURE) ×1 IMPLANT
SUT VIC AB 2-0 SH 27 (SUTURE)
SUT VIC AB 2-0 SH 27XBRD (SUTURE) IMPLANT
TOWEL OR 17X24 6PK STRL BLUE (TOWEL DISPOSABLE) ×3 IMPLANT
TRAY FOLEY CATH 14FR (SET/KITS/TRAYS/PACK) ×3 IMPLANT
WATER STERILE IRR 1000ML POUR (IV SOLUTION) ×3 IMPLANT

## 2014-06-03 NOTE — Progress Notes (Signed)
RN @ bedside, pt assisted into right lateral semi fowlers position.  FHR tracing maternal HR x2 minutes

## 2014-06-03 NOTE — Anesthesia Postprocedure Evaluation (Signed)
Anesthesia Post Note  Patient: Andrea Saunders  Procedure(s) Performed: Procedure(s) (LRB): CESAREAN SECTION (N/A)  Anesthesia type: Epidural  Patient location: PACU  Post pain: Pain level controlled  Post assessment: Post-op Vital signs reviewed  Last Vitals: BP 111/56  Pulse 109  Temp(Src) 38 C (Oral)  Resp 22  Ht  (1.651 m)  Wt 180 lb (81.647 kg)  BMI 29.95 kg/m2  SpO2 95%  LMP 08/27/2013  Breastfeeding? Unknown  Post vital signs: Reviewed  Level of consciousness: sedated  Complications: No apparent anesthesia complications

## 2014-06-03 NOTE — Op Note (Signed)
Cesarean Section Procedure Note   Andrea Saunders   06/02/2014 - 06/03/2014  Indications: Intrauterine pregnancy at 40 weeks, active labor, suspicious fetal heart tracing with repetitive severe variable decelerations   Pre-operative Diagnosis: Intrauterine pregnancy at 40 weeks, active labor, suspicious fetal heart tracing with repetitive severe variable decelerations   Post-operative Diagnosis: Same   Surgeon: Antionette Char A  Assistants: none  Anesthesia: epidural  Procedure Details:  The patient was seen in the Holding Room. The risks, benefits, complications, treatment options, and expected outcomes were discussed with the patient. The patient concurred with the proposed plan, giving informed consent. The patient was identified as Andrea Saunders and the procedure verified as C-Section Delivery. A Time Out was held and the above information confirmed.  After induction of anesthesia, the patient was draped and prepped in the usual sterile manner. A transverse incision was made and carried down through the subcutaneous tissue to the fascia. The fascial incision was made and extended transversely. The fascia was separated from the underlying rectus tissue superiorly. The peritoneum was identified and entered. The peritoneal incision was extended longitudinally. An Alexis retractor was placed into the incision.  The utero-vesical peritoneal reflection was incised transversely and the bladder flap was bluntly freed from the lower uterine segment. A low transverse uterine incision was made. Delivered from cephalic presentation was a living newborn female infant. APGAR (1 MIN): 8   APGAR (5 MINS): 9      A cord ph was sent. The umbilical cord was clamped and cut cord. The placenta was removed Intact and appeared normal.  The uterine incision was closed with running locked sutures of 1-0 Monocryl. A second imbricating layer of the same suture was placed.  Hemostasis was observed.The parieto peritoneum  was closed in a running fashion with 2-0 Vicryl.  The fascia was then reapproximated with running sutures of 0 Vicryl.  A running suture of 2-0 Vicryl was placed in the subcutaneous layer.  The skin was closed with suture.  Instrument, sponge, and needle counts were correct prior the abdominal closure and were correct at the conclusion of the case.    Findings: See above.  Loose nuchal cord x 1   Estimated Blood Loss: 600 ml  Total IV Fluids: per Anesthesiology  Urine Output: per Anesthesiology  Specimens: Placenta to Pathology   Complications: no complications  Disposition: PACU - hemodynamically stable.  Maternal Condition: stable   Baby condition / location:  Couplet care / Skin to Skin    Signed: Surgeon(s): Antionette Char, MD

## 2014-06-03 NOTE — Progress Notes (Signed)
Andrea Saunders is a 28 y.o. G4P0030 at [redacted]w[redacted]d by LMP admitted for active labor  Subjective: Comfortable  Objective: BP 152/81  Pulse 108  Temp(Src) 100.9 F (38.3 C) (Oral)  Resp 20  Ht  (1.651 m)  Wt 81.647 kg (180 lb)  BMI 29.95 kg/m2  SpO2 91%  LMP 08/27/2013   Total I/O In: -  Out: 450 [Urine:450]  FHT:  FHR: 170 bpm, variability: moderate,  accelerations:  Abscent,  decelerations:  Present variable decelerations--severe, repetitive UC:   regular, every 2 minutes SVE:   Dilation: 5 Effacement (%): 90 Station: -2 Exam by:: Dr. Tamela Oddi  Labs: Lab Results  Component Value Date   WBC 12.5* 06/02/2014   HGB 10.9* 06/02/2014   HCT 32.8* 06/02/2014   MCV 92.1 06/02/2014   PLT 397 06/02/2014    Assessment / Plan: Active labor, suspicious FHT with repetitive severe variable decelerations  Labor: protracted active phase Preeclampsia:  labs stable Fetal Wellbeing:  Category II Pain Control:  Epidural I/D:  Chorioamnionitis; add gentamicin/clindamycin Anticipated MOD:  Consented for a C/D  JACKSON-MOORE,Breck [place] A 06/03/2014, 2:12 AM

## 2014-06-03 NOTE — Addendum Note (Signed)
Addendum created 06/03/14 1002 by Algis Greenhouse, CRNA   Modules edited: Notes Section   Notes Section:  File: [id number]

## 2014-06-03 NOTE — Transfer of Care (Signed)
Immediate Anesthesia Transfer of Care Note  Patient: Andrea Saunders  Procedure(s) Performed: Procedure(s): CESAREAN SECTION (N/A)  Patient Location: PACU  Anesthesia Type:Epidural  Level of Consciousness: awake, alert , oriented and patient cooperative  Airway & Oxygen Therapy: Patient Spontanous Breathing  Post-op Assessment: Report given to PACU RN, Post -op Vital signs reviewed and stable and Patient moving all extremities X 4  Post vital signs: Reviewed and stable  Complications: No apparent anesthesia complications

## 2014-06-03 NOTE — Anesthesia Postprocedure Evaluation (Signed)
Anesthesia Post Note  Patient: Andrea Saunders  Procedure(s) Performed: Procedure(s) (LRB): CESAREAN SECTION (N/A)  Anesthesia type: Epidural  Patient location: Mother/Baby  Post pain: Pain level controlled  Post assessment: Post-op Vital signs reviewed  Last Vitals:  Filed Vitals:   06/03/14 0649  BP: 111/54  Pulse: 108  Temp: 37.3 C  Resp: 20    Post vital signs: Reviewed  Level of consciousness:alert  Complications: No apparent anesthesia complications

## 2014-06-03 NOTE — Progress Notes (Signed)
ANTIBIOTIC CONSULT NOTE - INITIAL  Pharmacy Consult for gentamicin Indication: maternal fever - R/O chorioamnionitis  No Known Allergies  Patient Measurements: Height:  (165.1 cm) Weight: 180 lb (81.647 kg) IBW/kg (Calculated) : 57 Adjusted Body Weight: 65 kg  Vital Signs: Temp: 100.9 F (38.3 C) (09/03 0155) Temp src: Oral (09/03 0155) BP: 152/81 mmHg (09/03 0101) Pulse Rate: 108 (09/03 0101) Intake/Output from previous day: 09/02 0701 - 09/03 0700 In: -  Out: 450 [Urine:450] Intake/Output from this shift: Total I/O In: -  Out: 450 [Urine:450]  Labs:  Recent Labs  06/02/14 1816 06/02/14 2049  WBC 12.5*  --   HGB 10.9*  --   PLT 397  --   CREATININE  --  0.59   Estimated Creatinine Clearance: 108.4 ml/min (by C-G formula based on Cr of 0.59). Has been on PCN for GBS(+)  Microbiology: Recent Results (from the past 720 hour(s))  URINE CULTURE     Status: None   Collection Time    05/06/14  5:32 PM      Result Value Ref Range Status   Colony Count NO GROWTH   Final   Organism ID, Bacteria NO GROWTH   Final  WET PREP BY MOLECULAR PROBE     Status: None   Collection Time    05/26/14  2:46 PM      Result Value Ref Range Status   Candida species NEG  Negative Final   Trichomonas vaginosis NEG  Negative Final   Gardnerella vaginalis NEG  Negative Final    Medical History: Past Medical History  Diagnosis Date  . History of miscarriage   . Abortion     x 2    Medications:  Scheduled:  . [MAR HOLD] gentamicin (GARAMYCIN) with clindamycin (CLEOCIN) IV   Intravenous Once   Infusions:   PRN:   Assessment: 89yr female term pregnancy, had started PCN for GBS(+), now for C-sect with maternal temp.  Goal of Therapy:  Desire gentamicin serum peak level to be 6-67mcg/ml, trough <24mcg/ml  Plan:  1. Clindamycin  & Gentamicin  IV x 1 at cord clamp 2. If continued post-op   - gentamicin  with clindamycin  IV q8h  Shirley Muscat  E 06/03/2014,2:18 AM

## 2014-06-03 NOTE — Addendum Note (Signed)
Addendum created 06/03/14 1507 by Orlie Pollen, CRNA   Modules edited: Anesthesia Responsible Staff

## 2014-06-04 ENCOUNTER — Encounter (HOSPITAL_COMMUNITY): Payer: Self-pay | Admitting: Obstetrics & Gynecology

## 2014-06-04 LAB — CBC
HCT: 24.5 % — ABNORMAL LOW (ref 36.0–46.0)
HEMOGLOBIN: 8 g/dL — AB (ref 12.0–15.0)
MCH: 30.2 pg (ref 26.0–34.0)
MCHC: 32.7 g/dL (ref 30.0–36.0)
MCV: 92.5 fL (ref 78.0–100.0)
PLATELETS: 321 10*3/uL (ref 150–400)
RBC: 2.65 MIL/uL — ABNORMAL LOW (ref 3.87–5.11)
RDW: 15.4 % (ref 11.5–15.5)
WBC: 17.1 10*3/uL — ABNORMAL HIGH (ref 4.0–10.5)

## 2014-06-04 MED ORDER — METOCLOPRAMIDE HCL 10 MG PO TABS: 10.0000 mg | ORAL_TABLET | Freq: Three times a day (TID) | ORAL | Status: DC

## 2014-06-04 MED ORDER — METOCLOPRAMIDE HCL 10 MG PO TABS: 5.0000 mg | ORAL_TABLET | Freq: Three times a day (TID) | ORAL | Status: DC

## 2014-06-04 MED ORDER — POLYSACCHARIDE IRON COMPLEX 150 MG PO CAPS
150.0000 mg | ORAL_CAPSULE | Freq: Every day | ORAL | Status: DC
  Administered 2014-06-04 – 2014-06-05 (×2): 150 mg via ORAL
  Filled 2014-06-04 (×2): qty 1

## 2014-06-04 MED ORDER — METOCLOPRAMIDE HCL 10 MG PO TABS
5.0000 mg | ORAL_TABLET | Freq: Three times a day (TID) | ORAL | Status: DC
  Administered 2014-06-04 – 2014-06-05 (×2): 5 mg via ORAL
  Filled 2014-06-04 (×2): qty 1

## 2014-06-04 NOTE — Progress Notes (Signed)
Subjective: Postpartum Day 1: Cesarean Delivery Patient reports tolerating PO, + flatus and no problems voiding.    Objective: Vital signs in last 24 hours: Temp:  [97.7 F (36.5 C)-98.6 F (37 C)] 98.2 F (36.8 C) (09/04 1308) Pulse Rate:  [73-92] 73 (09/04 0638) Resp:  [16-20] 16 (09/04 6578) BP: (108-124)/(61-76) 113/61 mmHg (09/04 0638) SpO2:  [97 %-100 %] 99 % (09/04 0327)  Physical Exam:  General: alert and no distress Lochia: appropriate Uterine Fundus: firm Incision: healing well DVT Evaluation: No evidence of DVT seen on physical exam.   Recent Labs  06/02/14 1816 06/04/14 0549  HGB 10.9* 8.0*  HCT 32.8* 24.5*    Assessment/Plan: Status post Cesarean section. Doing well postoperatively.  Anemia.  Clinically stable.  Iron Rx. Continue current care.  Reiko Vinje A 06/04/2014, 11:31 AM

## 2014-06-04 NOTE — Lactation Note (Addendum)
This note was copied from the chart of Andrea [employer] cabin [employer]. Lactation Consultation Note Follow up consult; mom states breast feeding is not going very well; baby is not latching well and it is painful when he does.  Mom is pumping regularly but not getting any volume yet, and is supplementing with formula and bottle. Reviewed hand expression, small glisten with hand expression. Attempted to latch baby, mom screaming in pain with latch, says baby is "biting me".  Discussed options, and initiated nipple shield with curved tip syringe, squirting formula into shield. Mom states much more comfortable with baby latched to nipple shield. Baby took 6 ml then asleep.  Comfort gels provided, with instructions for use. Inst mom to continue to attempt to breastfeed, using nipple shield if prefers, and squirt formula until her milk is flowing more. Reassured mom that bottle is ok too; to feed baby in whatever method she prefers. Mom states she does like feeding baby at the breast with nipple shield and syringe.  Inst mom to continue pumping if baby does not latch well for a feeding. Enc mom to call anytime she has a concern or needs assistance. Aunt present and helping with feeding.  Comfort gels provided with instructions for use.  Patient Name: Andrea Saunders [id number] Date: 06/04/2014 Reason for consult: Follow-up assessment;Difficult latch   Maternal Data    Feeding Feeding Type: Breast Fed Length of feed: 10 min  LATCH Score/Interventions Latch: Grasps breast easily, tongue down, lips flanged, rhythmical sucking.  Audible Swallowing: Spontaneous and intermittent (formula squirted into nipple shield)  Type of Nipple: Flat Intervention(s): Double electric pump  Comfort (Breast/Nipple): Filling, red/small blisters or bruises, mild/mod discomfort  Problem noted: Severe discomfort Interventions (Mild/moderate discomfort): Post-pump;Comfort gels;Breast shields  Hold (Positioning): Assistance needed to  correctly position infant at breast and maintain latch.  LATCH Score: 7  Lactation Tools Discussed/Used Tools: Nipple Shields;Comfort gels (syringe) Nipple shield size: 20   Consult Status Consult Status: Follow-up    Octavio Manns [facility] 06/04/2014, 1:23 PM

## 2014-06-04 NOTE — Progress Notes (Signed)
Dr. Gaynell Face contacted to inform that pt lost IV access and has scheduled IV medications. Order to d/c medications and leave IV out

## 2014-06-05 MED ORDER — IBUPROFEN 600 MG PO TABS: 600.0000 mg | ORAL_TABLET | Freq: Four times a day (QID) | ORAL | Status: DC | PRN

## 2014-06-05 MED ORDER — METOCLOPRAMIDE HCL 5 MG PO TABS: 5.0000 mg | ORAL_TABLET | Freq: Three times a day (TID) | ORAL | Status: DC

## 2014-06-05 MED ORDER — OXYCODONE-ACETAMINOPHEN 5-325 MG PO TABS: 1.0000 | ORAL_TABLET | ORAL | Status: DC | PRN

## 2014-06-05 NOTE — Discharge Summary (Signed)
Obstetric Discharge Summary Reason for Admission: onset of labor Prenatal Procedures: ultrasound Intrapartum Procedures: cesarean: low cervical, transverse Postpartum Procedures: none Complications-Operative and Postpartum: none Hemoglobin  Date Value Ref Range Status  06/04/2014 8.0* 12.0 - 15.0 g/dL Final     DELTA CHECK NOTED     REPEATED TO VERIFY     HCT  Date Value Ref Range Status  06/04/2014 24.5* 36.0 - 46.0 % Final    Physical Exam:  General: alert and no distress Lochia: appropriate Uterine Fundus: firm Incision: healing well DVT Evaluation: No evidence of DVT seen on physical exam.  Discharge Diagnoses: Term Pregnancy-delivered  Discharge Information: Date: 06/05/2014 Activity: pelvic rest Diet: routine Medications: PNV, Ibuprofen, Colace and Percocet Condition: stable Instructions: refer to practice specific booklet Discharge to: home Follow-up Information   Follow up with HARPER,CHARLES A, MD On 06/11/2014. (06/11/2014 @ 11:00 am)    Specialty:  Obstetrics and Gynecology   Contact information:   [address] [place] [address] [phone]       Follow up with HARPER,CHARLES A, MD. Schedule an appointment as soon as possible for a visit in 2 weeks.   Specialty:  Obstetrics and Gynecology   Contact information:   [address] [place] [address] [phone]       Newborn Data: Live born female  Birth Weight: 6 lb 8.2 oz (2955 g) APGAR: 8, 9  Home with mother.  HARPER,CHARLES A 06/05/2014, 8:51 AM

## 2014-06-05 NOTE — Progress Notes (Signed)
Subjective: Postpartum Day 2: Cesarean Delivery Patient reports tolerating PO, + flatus, + BM and no problems voiding.    Objective: Vital signs in last 24 hours: Temp:  [98 F (36.7 C)-98.3 F (36.8 C)] 98 F (36.7 C) (09/05 0550) Pulse Rate:  [80-85] 80 (09/05 0550) Resp:  [18] 18 (09/05 0550) BP: (118-122)/(68-70) 122/70 mmHg (09/05 0550)  Physical Exam:  General: alert and no distress Lochia: appropriate Uterine Fundus: firm Incision: healing well DVT Evaluation: No evidence of DVT seen on physical exam.   Recent Labs  06/02/14 1816 06/04/14 0549  HGB 10.9* 8.0*  HCT 32.8* 24.5*    Assessment/Plan: Status post Cesarean section. Doing well postoperatively.  Anemia.  Clinically stable.  Iron Rx. Discharge home with standard precautions and return to clinic in 2 weeks.  Denym Christenberry A 06/05/2014, 8:37 AM

## 2014-06-05 NOTE — Discharge Instructions (Signed)

## 2014-06-09 ENCOUNTER — Encounter: Payer: BC Managed Care – PPO | Admitting: Obstetrics

## 2014-06-11 ENCOUNTER — Ambulatory Visit (INDEPENDENT_AMBULATORY_CARE_PROVIDER_SITE_OTHER): Payer: BC Managed Care – PPO | Admitting: Obstetrics

## 2014-06-11 DIAGNOSIS — Z3009 Encounter for other general counseling and advice on contraception: Secondary | ICD-10-CM

## 2014-06-13 ENCOUNTER — Encounter: Payer: Self-pay | Admitting: Obstetrics

## 2014-06-13 NOTE — Progress Notes (Signed)
Subjective:     Andrea Saunders is a 28 y.o. female who presents for a postpartum visit. She is 1 week postpartum following a low cervical transverse Cesarean section. I have fully reviewed the prenatal and intrapartum course. The delivery was at 40 gestational weeks. Outcome: primary cesarean section, low transverse incision. Anesthesia: spinal. Postpartum course has been normal. Baby's course has been normal. Baby is feeding by breast. Bleeding thin lochia. Bowel function is normal. Bladder function is normal. Patient is not sexually active. Contraception method is abstinence. Postpartum depression screening: negative.  Tobacco, alcohol and substance abuse history reviewed.  Adult immunizations reviewed including TDAP, rubella and varicella.  The following portions of the patient's history were reviewed and updated as appropriate: allergies, current medications, past family history, past medical history, past social history, past surgical history and problem list.  Review of Systems A comprehensive review of systems was negative.   Objective:    BP 133/86  Pulse 67  Temp(Src) 98.5 F (36.9 C)  Wt 161 lb (73.029 kg)  LMP 08/27/2013  General:  alert and no distress   Breasts:  inspection negative, no nipple discharge or bleeding, no masses or nodularity palpable  Lungs: not evaluated  Heart:  not evaluated  Abdomen: normal findings: soft, non-tender and incision C, D, I.   Vulva:  not evaluated  Vagina: not evaluated  Cervix:  not evaluated  Corpus: not examined  Adnexa:  not evaluated  Rectal Exam: Not performed.           Assessment:     Normal postpartum exam. Pap smear not done at today's visit.  Plan:    1. Contraception: Discussed 2. Continue 3. Follow up in: 2 weeks or as needed.  2hr GTT for h/o GDM/screening for DM q 3 yrs per ADA recommendations Preconception counseling provided Healthy lifestyle practices reviewed

## 2014-06-28 ENCOUNTER — Encounter: Payer: Self-pay | Admitting: Obstetrics

## 2014-06-28 ENCOUNTER — Ambulatory Visit (INDEPENDENT_AMBULATORY_CARE_PROVIDER_SITE_OTHER): Payer: BC Managed Care – PPO | Admitting: Obstetrics

## 2014-06-28 DIAGNOSIS — Z3009 Encounter for other general counseling and advice on contraception: Secondary | ICD-10-CM

## 2014-06-28 MED ORDER — NORETHINDRONE 0.35 MG PO TABS: 1.0000 | ORAL_TABLET | Freq: Every day | ORAL | Status: DC

## 2014-06-28 NOTE — Progress Notes (Signed)
Subjective:     Andrea Saunders is a 28 y.o. female who presents for a postpartum visit. She is 8 weeks postpartum following a low cervical transverse Cesarean section. I have fully reviewed the prenatal and intrapartum course. The delivery was at 40 gestational weeks. Outcome: primary cesarean section, low transverse incision. Anesthesia: epidural. Postpartum course has been vormal. Baby's course has been normal. Baby is feeding by both breast and bottle - Similac. Bleeding thin lochia. Bowel function is normal. Bladder function is normal. Patient is not sexually active. Contraception method is oral progesterone-only contraceptive. Postpartum depression screening: negative.  Tobacco, alcohol and substance abuse history reviewed.  Adult immunizations reviewed including TDAP, rubella and varicella.  The following portions of the patient's history were reviewed and updated as appropriate: allergies, current medications, past family history, past medical history, past social history, past surgical history and problem list.  Review of Systems A comprehensive review of systems was negative.   Objective:    BP 125/75  Pulse 80  Temp(Src) 97.4 F (36.3 C)  Ht  (1.651 m)  Wt 157 lb (71.215 kg)  BMI 26.13 kg/m2  Breastfeeding? Yes  General:  alert and no distress   Breasts:  inspection negative, no nipple discharge or bleeding, no masses or nodularity palpable  Lungs: nnot evaluated  Heart:  not evaluated  Abdomen: normal findings: soft, non-tender   Vulva:  normal  Vagina: normal vagina  Cervix:  no cervical motion tenderness  Corpus: normal size, contour, position, consistency, mobility, non-tender  Adnexa:  normal adnexa  Rectal Exam: Not performed.           Assessment:     Normal postpartum exam. Pap smear not done at today's visit.   Plan:    1. Contraception: oral progesterone-only contraceptive 2. Continue PNV's 3. Follow up in: 3 weeks or as needed.   Healthy lifestyle  practices reviewed

## 2014-07-14 ENCOUNTER — Telehealth: Payer: Self-pay | Admitting: *Deleted

## 2014-07-14 DIAGNOSIS — O9102 Infection of nipple associated with the puerperium: Secondary | ICD-10-CM

## 2014-07-14 DIAGNOSIS — O219 Vomiting of pregnancy, unspecified: Secondary | ICD-10-CM

## 2014-07-14 DIAGNOSIS — B3789 Other sites of candidiasis: Secondary | ICD-10-CM

## 2014-07-14 MED ORDER — FLUCONAZOLE 150 MG PO TABS: 150.0000 mg | ORAL_TABLET | ORAL | Status: DC

## 2014-07-14 NOTE — Telephone Encounter (Signed)
Patient is experiencing itching on breast. She is no longer breast feeding . She has an appointment on Monday. Diflucan called to the pharmacy and patient to try treatment. Patient to discuss birth control with Dr Clearance CootsHarper next visit.

## 2014-07-19 ENCOUNTER — Ambulatory Visit (INDEPENDENT_AMBULATORY_CARE_PROVIDER_SITE_OTHER): Payer: BC Managed Care – PPO | Admitting: Obstetrics

## 2014-07-19 DIAGNOSIS — Z30011 Encounter for initial prescription of contraceptive pills: Secondary | ICD-10-CM

## 2014-07-19 MED ORDER — NORETHIN ACE-ETH ESTRAD-FE 1.5-30 MG-MCG PO TABS: 1.0000 | ORAL_TABLET | Freq: Every day | ORAL | Status: DC

## 2014-07-20 ENCOUNTER — Encounter: Payer: Self-pay | Admitting: Obstetrics

## 2014-07-20 NOTE — Progress Notes (Signed)
Subjective:     Andrea Saunders is a 28 y.o. female who presents for a postpartum visit. She is 6 weeks postpartum following a low cervical transverse Cesarean section. I have fully reviewed the prenatal and intrapartum course. The delivery was at 39 gestational weeks. Outcome: primary cesarean section, low transverse incision. Anesthesia: epidural. Postpartum course has been normal. Baby's course has been normal. Baby is feeding by breast. Bleeding no bleeding. Bowel function is normal. Bladder function is normal. Patient is not sexually active. Contraception method is abstinence. Postpartum depression screening: negative.  Tobacco, alcohol and substance abuse history reviewed.  Adult immunizations reviewed including TDAP, rubella and varicella.  The following portions of the patient's history were reviewed and updated as appropriate: allergies, current medications, past family history, past medical history, past social history, past surgical history and problem list.  Review of Systems A comprehensive review of systems was negative.   Objective:    BP 125/81  Pulse 61  Temp(Src) 98.1 F (36.7 C)  Wt 148 lb (67.132 kg)  LMP 07/12/2014  Breastfeeding? No  General:  alert and no distress   Breasts:  inspection negative, no nipple discharge or bleeding, no masses or nodularity palpable  Lungs: clear to auscultation bilaterally  Heart:  regular rate and rhythm, S1, S2 normal, no murmur, click, rub or gallop  Abdomen: normal findings: soft, non-tender   Vulva:  normal  Vagina: normal vagina  Cervix:  no cervical motion tenderness  Corpus: normal size, contour, position, consistency, mobility, non-tender  Adnexa:  no mass, fullness, tenderness  Rectal Exam: Not performed.           Assessment:     Normal postpartum exam. Pap smear not done at today's visit.  Plan:    1. Contraception: oral progesterone-only contraceptive 2. Continue PNV's 3. Follow up in: 6 weeks or as needed.  2hr  GTT for h/o GDM/screening for DM q 3 yrs per ADA recommendations Preconception counseling provided Healthy lifestyle practices reviewed

## 2014-08-02 ENCOUNTER — Encounter: Payer: Self-pay | Admitting: Obstetrics

## 2014-09-27 ENCOUNTER — Encounter: Payer: Self-pay | Admitting: *Deleted

## 2014-09-28 ENCOUNTER — Encounter: Payer: Self-pay | Admitting: Obstetrics & Gynecology

## 2014-10-02 IMAGING — US US OB TRANSVAGINAL
1 series · 14 of 28 positions shown · non-contrast
Comparison: None.

CLINICAL DATA: Vaginal bleeding.  Positive pregnancy test.  6-week-
4-day gestational age by LMP.

OBSTETRIC <14 WK US AND TRANSVAGINAL OB US
TECHNIQUE: Both transabdominal and transvaginal ultrasound
examinations were performed for complete evaluation of the
gestation as well as the maternal uterus, adnexal regions, and
pelvic cul-de-sac.  Transvaginal technique was performed to assess
early pregnancy.

[Series 1: us ob transvaginal · 0.28mm/px · 14 of 48 slices shown]
[im 2/48]
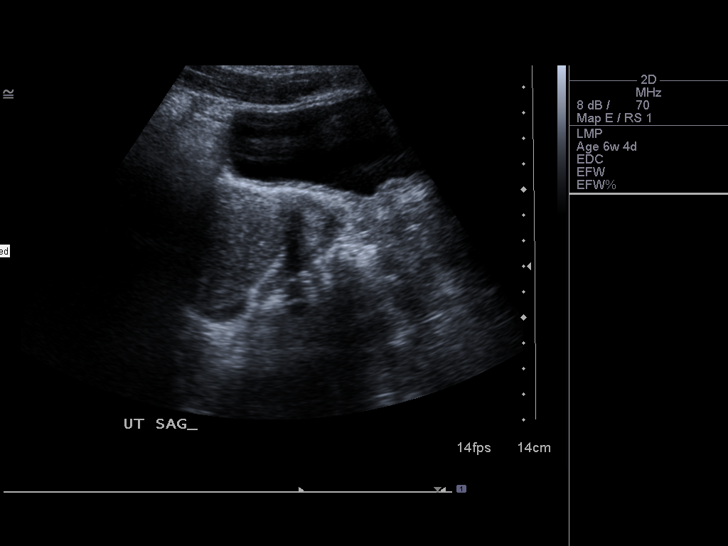
[im 6/48]
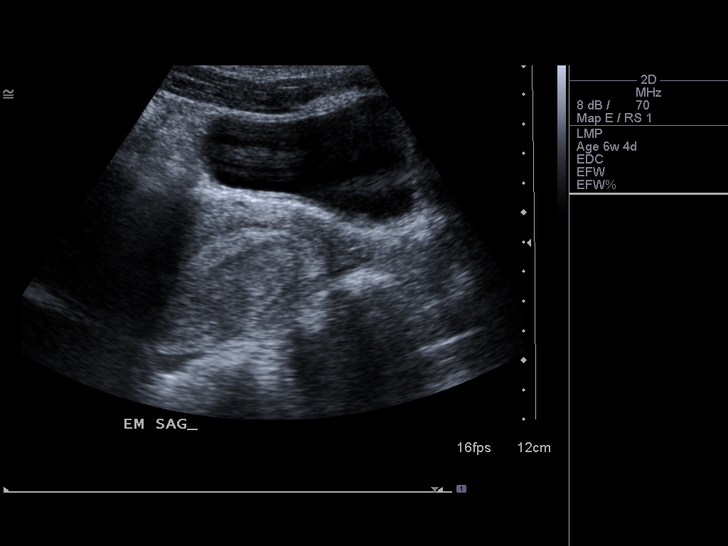
[im 9/48]
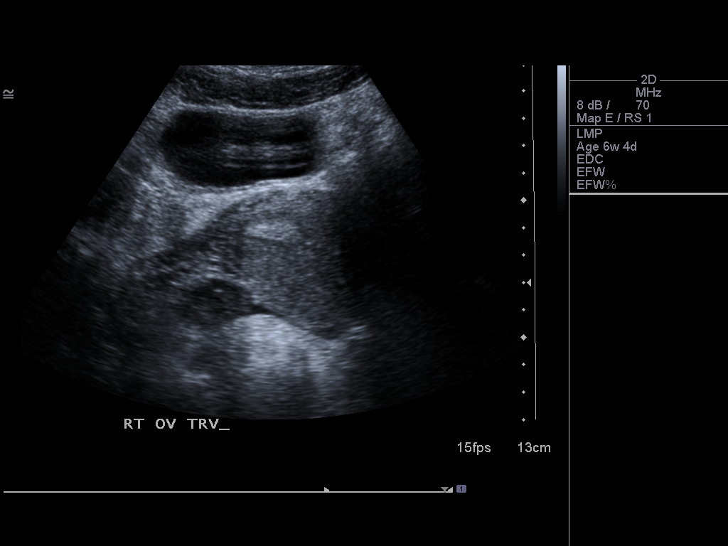
[im 13/48]
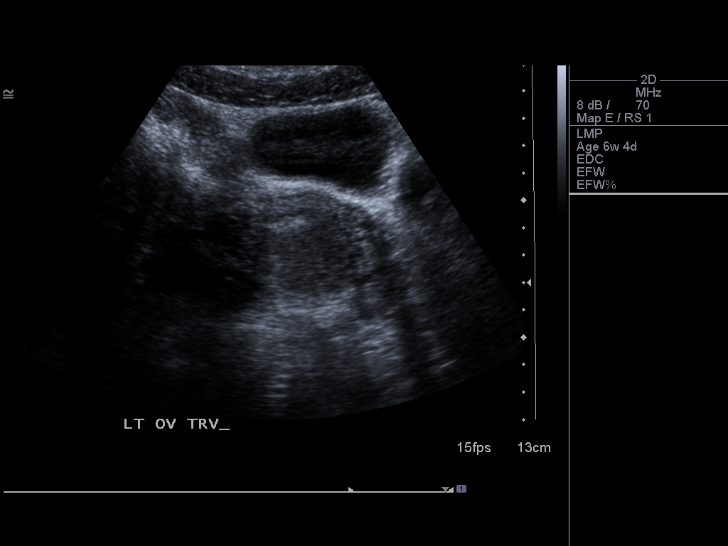
[im 16/48]
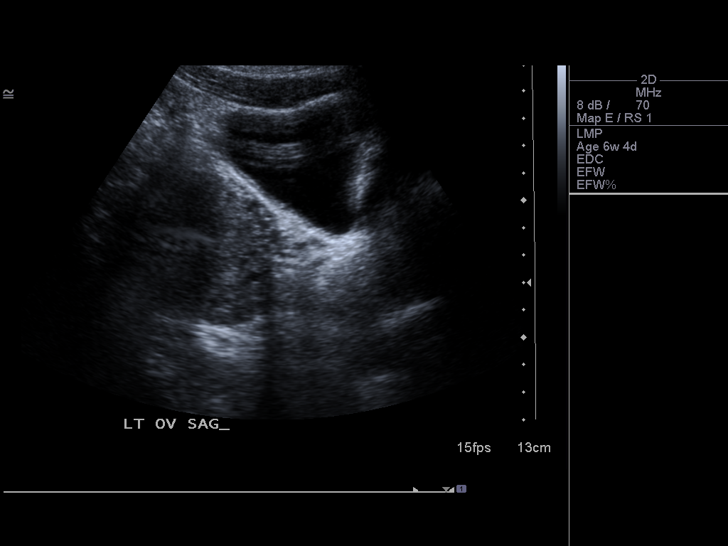
[im 20/48]
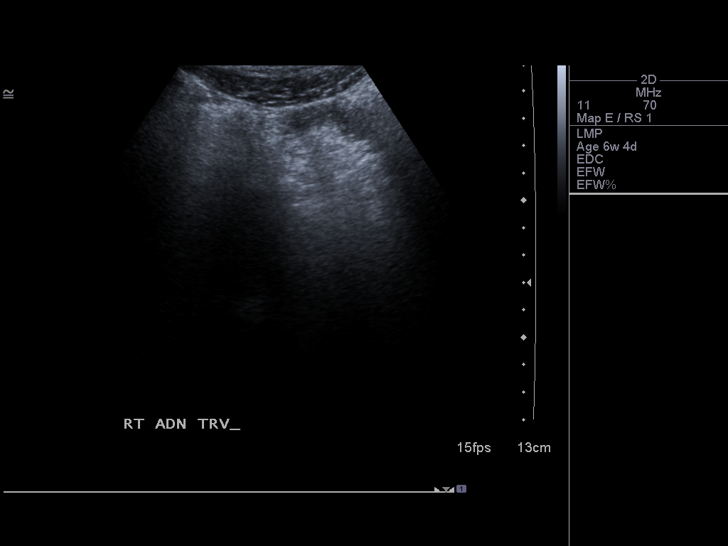
[im 23/48]
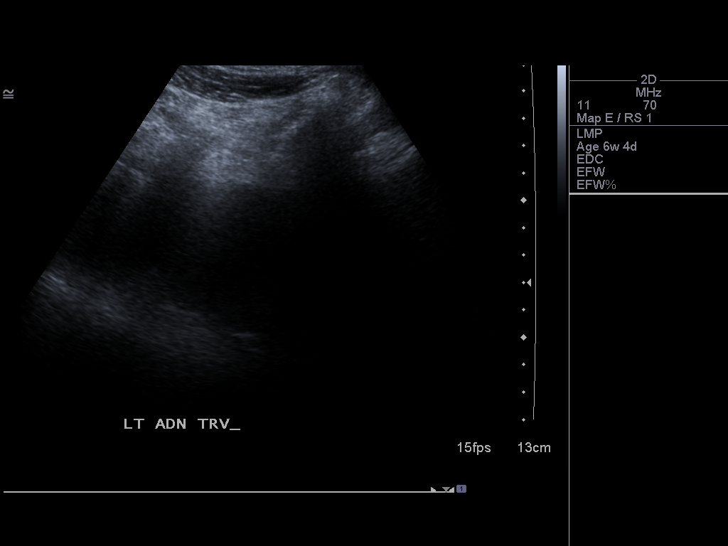
[im 27/48]
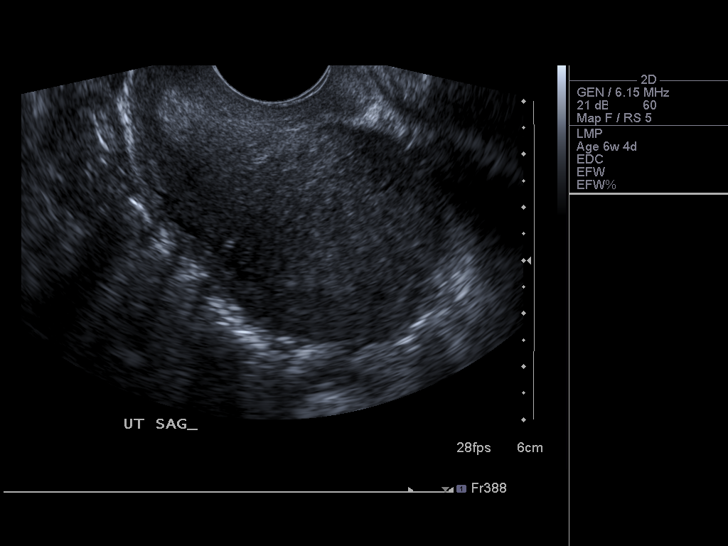
[im 30/48]
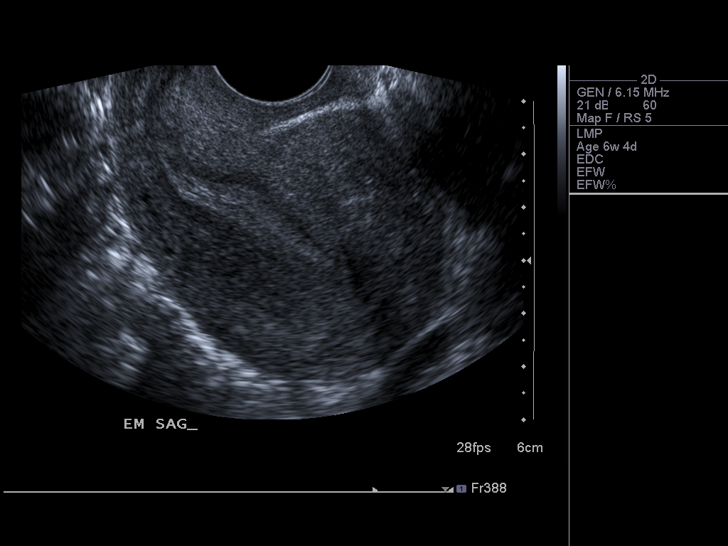
[im 34/48]
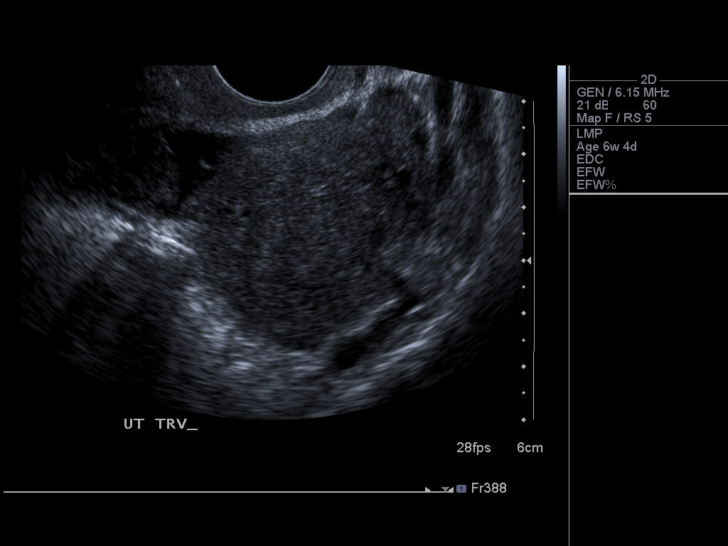
[im 37/48]
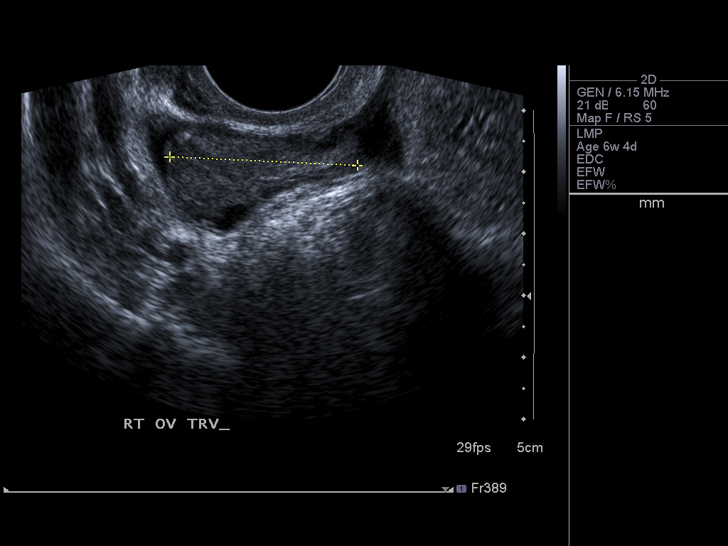
[im 41/48]
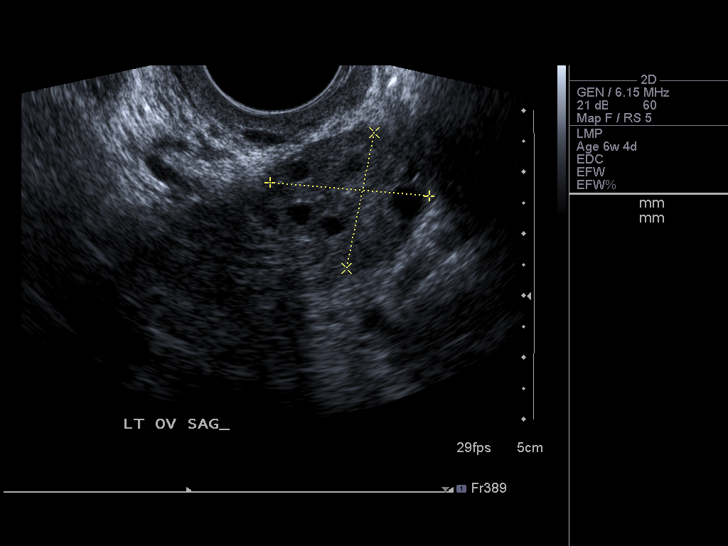
[im 44/48]
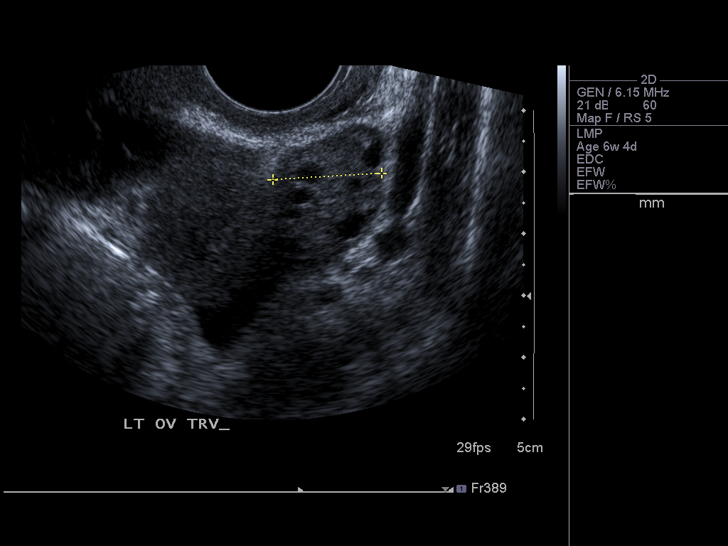
[im 48/48]
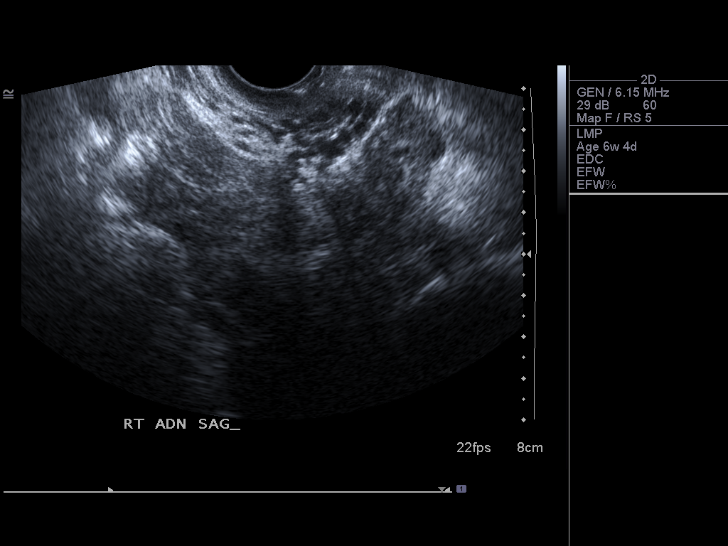

[14 of 28 positions shown; findings below may reference images not displayed]

FINDINGS: Uterus is retroverted.  No intrauterine gestational sac
is identified.  No other fluid collection seen within the
endometrial cavity.  No fibroids identified.

Both ovaries are normal in appearance.  No adnexal mass identified.
A trace amount of free fluid is seen in the right adnexa and
adjacent to the uterine fundus.
IMPRESSION: No IUP or adnexal mass identified.  Differential diagnosis includes
recent spontaneous abortion, IUP too early to visualize, and occult
ectopic pregnancy.  Recommend close follow-up of quantitative B-HCG
levels, and followup US as clinically warranted.

## 2014-10-12 ENCOUNTER — Ambulatory Visit: Payer: BC Managed Care – PPO | Admitting: Obstetrics

## 2015-08-23 ENCOUNTER — Other Ambulatory Visit: Payer: Self-pay | Admitting: *Deleted

## 2015-08-23 DIAGNOSIS — N760 Acute vaginitis: Secondary | ICD-10-CM

## 2015-08-23 MED ORDER — METRONIDAZOLE 500 MG PO TABS: 500.0000 mg | ORAL_TABLET | Freq: Two times a day (BID) | ORAL | Status: DC

## 2015-10-13 ENCOUNTER — Encounter: Payer: Self-pay | Admitting: Obstetrics

## 2015-10-13 ENCOUNTER — Ambulatory Visit (INDEPENDENT_AMBULATORY_CARE_PROVIDER_SITE_OTHER): Payer: BC Managed Care – PPO | Admitting: Obstetrics

## 2015-10-13 VITALS — BP 115/81 | HR 84 | Temp 98.0°F | Ht 65.0 in | Wt 124.0 lb

## 2015-10-13 DIAGNOSIS — Z01419 Encounter for gynecological examination (general) (routine) without abnormal findings: Secondary | ICD-10-CM | POA: Diagnosis not present

## 2015-10-13 DIAGNOSIS — Z30011 Encounter for initial prescription of contraceptive pills: Secondary | ICD-10-CM

## 2015-10-13 DIAGNOSIS — N898 Other specified noninflammatory disorders of vagina: Secondary | ICD-10-CM

## 2015-10-13 DIAGNOSIS — N760 Acute vaginitis: Secondary | ICD-10-CM

## 2015-10-13 MED ORDER — NORETHIN ACE-ETH ESTRAD-FE 1-20 MG-MCG(24) PO TABS: 1.0000 | ORAL_TABLET | Freq: Every day | ORAL | Status: DC

## 2015-10-13 MED ORDER — METRONIDAZOLE 500 MG PO TABS: 500.0000 mg | ORAL_TABLET | Freq: Two times a day (BID) | ORAL | Status: DC

## 2015-10-13 NOTE — Patient Instructions (Signed)

## 2015-10-13 NOTE — Progress Notes (Signed)
Subjective:        Andrea Saunders is a 30 y.o. female here for a routine exam.  Current complaints: malodorous vaginal discharge  Personal health questionnaire:  Is patient Ashkenazi Jewish, have a family history of breast and/or ovarian cancer: no Is there a family history of uterine cancer diagnosed at age < 51, gastrointestinal cancer, urinary tract cancer, family member who is a Personnel officer syndrome-associated carrier: no Is the patient overweight and hypertensive, family history of diabetes, personal history of gestational diabetes, preeclampsia or PCOS: no Is patient over 62, have PCOS,  family history of premature CHD under age 21, diabetes, smoke, have hypertension or peripheral artery disease:  no At any time, has a partner hit, kicked or otherwise hurt or frightened you?: no Over the past 2 weeks, have you felt down, depressed or hopeless?: no Over the past 2 weeks, have you felt little interest or pleasure in doing things?:no   Gynecologic History Patient's last menstrual period was 10/13/2015. Contraception: OCP (estrogen/progesterone) Last Pap: 2014. Results were: normal Last mammogram: n/a. Results were: n/a  Obstetric History OB History  Gravida Para Term Preterm AB SAB TAB Ectopic Multiple Living  4 1 1  3 1 2   1     # Outcome Date GA Lbr Len/2nd Weight Sex Delivery Anes PTL Lv  4 Term 06/03/14 [redacted]w[redacted]d  6 lb 8.2 oz (2.955 kg) Wandalee Ferdinand EPI  Y  3 SAB 10/02/11 [redacted]w[redacted]d         2 TAB 10/01/09          1 TAB 10/01/02              Past Medical History  Diagnosis Date  . History of miscarriage   . Abortion     x 2    Past Surgical History  Procedure Laterality Date  . Induced abortion      x 2  . Wisdom tooth extraction    . Cesarean section N/A 06/03/2014    Procedure: CESAREAN SECTION;  Surgeon: Antionette Char, MD;  Location: [facility];  Service: Obstetrics;  Laterality: N/A;     Current outpatient prescriptions:  .  metroNIDAZOLE (FLAGYL) 500 MG tablet, Take  1 tablet (500 mg total) by mouth 2 (two) times daily., Disp: 14 tablet, Rfl: 2 .  Norethindrone Acetate-Ethinyl Estrad-FE (LOESTRIN 24 FE) 1-20 MG-MCG(24) tablet, Take 1 tablet by mouth daily., Disp: 1 Package, Rfl: 11 Allergies  Allergen Reactions  . Adhesive [Tape]     Social History  Substance Use Topics  . Smoking status: Never Smoker   . Smokeless tobacco: Never Used  . Alcohol Use: No    Family History  Problem Relation Age of Onset  . Hypertension Mother   . Hypertension Sister       Review of Systems  Constitutional: negative for fatigue and weight loss Respiratory: negative for cough and wheezing Cardiovascular: negative for chest pain, fatigue and palpitations Gastrointestinal: negative for abdominal pain and change in bowel habits Musculoskeletal:negative for myalgias Neurological: negative for gait problems and tremors Behavioral/Psych: negative for abusive relationship, depression Endocrine: negative for temperature intolerance   Genitourinary: positive for malodorous vaginal discharge Integument/breast: negative for breast lump, breast tenderness, nipple discharge and skin lesion(s)    Objective:       BP 115/81 mmHg  Pulse 84  Temp(Src) 98 F (36.7 C)  Ht 5\' 5"  (1.651 m)  Wt 124 lb (56.246 kg)  BMI 20.63 kg/m2  LMP 10/13/2015 General:   alert  Skin:   no rash or abnormalities  Lungs:   clear to auscultation bilaterally  Heart:   regular rate and rhythm, S1, S2 normal, no murmur, click, rub or gallop  Breasts:   normal without suspicious masses, skin or nipple changes or axillary nodes  Abdomen:  normal findings: no organomegaly, soft, non-tender and no hernia  Pelvis:  External genitalia: normal general appearance Urinary system: urethral meatus normal and bladder without fullness, nontender Vaginal: normal without tenderness, induration or masses Cervix: normal appearance Adnexa: normal bimanual exam Uterus: anteverted and non-tender, normal size    Lab Review Urine pregnancy test Labs reviewed yes Radiologic studies reviewed no    Assessment:    Healthy female exam.    Contraceptive surveillance.  Pleased with OCP's.   Plan:    Education reviewed: calcium supplements, low fat, low cholesterol diet, safe sex/STD prevention, self breast exams and weight bearing exercise. Contraception: OCP (estrogen/progesterone). Follow up in: 1 year.   Meds ordered this encounter  Medications  . metroNIDAZOLE (FLAGYL) 500 MG tablet    Sig: Take 1 tablet (500 mg total) by mouth 2 (two) times daily.    Dispense:  14 tablet    Refill:  2  . Norethindrone Acetate-Ethinyl Estrad-FE (LOESTRIN 24 FE) 1-20 MG-MCG(24) tablet    Sig: Take 1 tablet by mouth daily.    Dispense:  1 Package    Refill:  11   Orders Placed This Encounter  Procedures  . SureSwab, Vaginosis/Vaginitis Plus

## 2015-10-14 ENCOUNTER — Ambulatory Visit: Payer: Self-pay | Admitting: Obstetrics

## 2015-10-17 LAB — PAP, TP IMAGING W/ HPV RNA, RFLX HPV TYPE 16,18/45: HPV MRNA, HIGH RISK: NOT DETECTED

## 2015-10-18 ENCOUNTER — Other Ambulatory Visit: Payer: Self-pay | Admitting: Obstetrics

## 2015-10-18 LAB — SURESWAB, VAGINOSIS/VAGINITIS PLUS
Atopobium vaginae: NOT DETECTED Log (cells/mL)
BV CATEGORY: UNDETERMINED — AB
C. albicans, DNA: NOT DETECTED
C. glabrata, DNA: NOT DETECTED
C. parapsilosis, DNA: NOT DETECTED
C. trachomatis RNA, TMA: NOT DETECTED
C. tropicalis, DNA: NOT DETECTED
Gardnerella vaginalis: 6.4 Log (cells/mL)
LACTOBACILLUS SPECIES: DETECTED Log (cells/mL)
MEGASPHAERA SPECIES: NOT DETECTED Log (cells/mL)
N. GONORRHOEAE RNA, TMA: NOT DETECTED
T. vaginalis RNA, QL TMA: NOT DETECTED

## 2015-11-01 ENCOUNTER — Ambulatory Visit: Payer: Self-pay | Admitting: Obstetrics

## 2016-05-29 ENCOUNTER — Ambulatory Visit (INDEPENDENT_AMBULATORY_CARE_PROVIDER_SITE_OTHER): Payer: BC Managed Care – PPO | Admitting: Obstetrics

## 2016-05-29 ENCOUNTER — Encounter: Payer: Self-pay | Admitting: Obstetrics

## 2016-05-29 VITALS — BP 133/85 | HR 66 | Temp 97.7°F | Wt 127.4 lb

## 2016-05-29 DIAGNOSIS — Z30011 Encounter for initial prescription of contraceptive pills: Secondary | ICD-10-CM

## 2016-05-29 DIAGNOSIS — Z113 Encounter for screening for infections with a predominantly sexual mode of transmission: Secondary | ICD-10-CM | POA: Diagnosis not present

## 2016-05-29 DIAGNOSIS — A64 Unspecified sexually transmitted disease: Secondary | ICD-10-CM | POA: Diagnosis not present

## 2016-05-29 DIAGNOSIS — Z3009 Encounter for other general counseling and advice on contraception: Secondary | ICD-10-CM

## 2016-05-29 MED ORDER — NORETHIN ACE-ETH ESTRAD-FE 1-20 MG-MCG(24) PO TABS: 1.0000 | ORAL_TABLET | Freq: Every day | ORAL | 11 refills | Status: DC

## 2016-05-29 NOTE — Progress Notes (Signed)
[name], female   DOB: [birth year], 30 y.o.   MRN: [id number] Patient ID: Andrea HaringLatoya S Imai, female   DOB: [birth year], 30 y.o.   MRN: [id number]  Chief Complaint  Patient presents with  . Gynecologic Exam    follow up-  STD check /wet prep    HPI Andrea HaringLatoya S Retherford is a 30 y.o. female.  Patient has new partner and would like STD check. HPI  Past Medical History:  Diagnosis Date  . Abortion    x 2  . History of miscarriage     Past Surgical History:  Procedure Laterality Date  . CESAREAN SECTION N/A 06/03/2014   Procedure: CESAREAN SECTION;  Surgeon: Antionette CharLisa Jackson-Moore, MD;  Location: [facility];  Service: Obstetrics;  Laterality: N/A;  . INDUCED ABORTION     x 2  . WISDOM TOOTH EXTRACTION      Family History  Problem Relation Age of Onset  . Hypertension Mother   . Hypertension Sister     Social History Social History  Substance Use Topics  . Smoking status: Never Smoker  . Smokeless tobacco: Never Used  . Alcohol use No    Allergies  Allergen Reactions  . Adhesive [Tape]     Current Outpatient Prescriptions  Medication Sig Dispense Refill  . Norethindrone Acetate-Ethinyl Estrad-FE (LOESTRIN 24 FE) 1-20 MG-MCG(24) tablet Take 1 tablet by mouth daily. 1 Package 11   No current facility-administered medications for this visit.     Review of Systems Review of Systems Constitutional: negative for fatigue and weight loss Respiratory: negative for cough and wheezing Cardiovascular: negative for chest pain, fatigue and palpitations Gastrointestinal: negative for abdominal pain and change in bowel habits Genitourinary:negative Integument/breast: negative for nipple discharge Musculoskeletal:negative for myalgias Neurological: negative for gait problems and tremors Behavioral/Psych: negative for abusive relationship, depression Endocrine: negative for temperature intolerance     Blood pressure 133/85, pulse 66, temperature 97.7 F (36.5 C), weight 127 lb  6.4 oz (57.8 kg), last menstrual period 05/11/2016, not currently breastfeeding.  Physical Exam Physical Exam           General:  Alert and no distress Abdomen:  normal findings: no organomegaly, soft, non-tender and no hernia  Pelvis:  External genitalia: normal general appearance Urinary system: urethral meatus normal and bladder without fullness, nontender Vaginal: normal without tenderness, induration or masses Cervix: normal appearance Adnexa: normal bimanual exam Uterus: anteverted and non-tender, normal size    50% of 15 min visit spent on counseling and coordination of care.   Data Reviewed Labs  Assessment     Screening for STD.    Plan    Wet prep and cultures done F/U prn  No orders of the defined types were placed in this encounter.  Meds ordered this encounter  Medications  . Norethindrone Acetate-Ethinyl Estrad-FE (LOESTRIN 24 FE) 1-20 MG-MCG(24) tablet    Sig: Take 1 tablet by mouth daily.    Dispense:  1 Package    Refill:  11

## 2016-05-29 NOTE — Addendum Note (Signed)
Addended by: Brock BadHARPER, CHARLES A on: 05/29/2016 04:35 PM   Modules accepted: Orders

## 2016-06-07 LAB — NUSWAB VG+, CANDIDA 6SP
CANDIDA ALBICANS, NAA: NEGATIVE
CANDIDA TROPICALIS, NAA: NEGATIVE
CHLAMYDIA TRACHOMATIS, NAA: NEGATIVE
Candida glabrata, NAA: NEGATIVE
Candida krusei, NAA: NEGATIVE
Candida lusitaniae, NAA: NEGATIVE
Candida parapsilosis, NAA: NEGATIVE
Neisseria gonorrhoeae, NAA: NEGATIVE
Trich vag by NAA: NEGATIVE

## 2016-06-20 ENCOUNTER — Telehealth: Payer: Self-pay | Admitting: *Deleted

## 2016-06-20 NOTE — Telephone Encounter (Signed)
Patient having several symptoms from taking BCP that she does not like example nausea.Concerned about getting clots.Explained all birth control can do this.Explained the different choices that remain and possible side effects and patient decided she did not want any of them.Told patient not to just stop pills this will get her cycle out of sync.Finish last 3 pills that are left.She wants to only use condoms for now which is her choice but if she changes her mind she can make an appointment to come in and talk with the MD.

## 2016-06-27 ENCOUNTER — Telehealth: Payer: Self-pay | Admitting: *Deleted

## 2016-06-27 NOTE — Telephone Encounter (Signed)
Patient decided not to continue her birth control pills due to irregular cycles.She takes a pill like Seasonique.Her cycle came on again and now she has a vaginal discharge with an odor.Metrogel per her request 1 application per vagina x 5 days called to [facility] on [address] [place] and Diflucan 150 mg 1 po x1 called also [phone].

## 2016-08-14 ENCOUNTER — Other Ambulatory Visit: Payer: Self-pay | Admitting: *Deleted

## 2016-08-14 ENCOUNTER — Telehealth: Payer: Self-pay | Admitting: *Deleted

## 2016-08-14 DIAGNOSIS — B379 Candidiasis, unspecified: Secondary | ICD-10-CM

## 2016-08-14 MED ORDER — FLUCONAZOLE 150 MG PO TABS: 150.0000 mg | ORAL_TABLET | Freq: Once | ORAL | 0 refills | Status: DC

## 2016-08-14 NOTE — Telephone Encounter (Signed)
Patient has been notified that medication is at the pharmacy.Marland Kitchen..Marland Kitchen

## 2016-08-14 NOTE — Progress Notes (Signed)
Pt called to office with symptoms of yeast infection. Pt would like oral medication prescribed.  Diflucan 150mg  sent to pharmacy per protocol.

## 2016-09-19 ENCOUNTER — Other Ambulatory Visit: Payer: Self-pay | Admitting: Obstetrics

## 2016-10-17 ENCOUNTER — Ambulatory Visit: Payer: Self-pay | Admitting: Obstetrics

## 2016-11-13 ENCOUNTER — Telehealth: Payer: Self-pay | Admitting: *Deleted

## 2016-11-13 NOTE — Telephone Encounter (Signed)
Patient took her son to the doctor today- he has been diagnosed with the flu. She was told to call her doctor for Tamiflu Rx for preventative. She wants to know if Dr Clearance CootsHarper will fill that for her. Told patient I would ask. Please call to [facility]/ [address].

## 2016-11-15 ENCOUNTER — Other Ambulatory Visit: Payer: Self-pay | Admitting: Obstetrics

## 2016-11-15 DIAGNOSIS — J111 Influenza due to unidentified influenza virus with other respiratory manifestations: Secondary | ICD-10-CM

## 2016-11-15 MED ORDER — OSELTAMIVIR PHOSPHATE 75 MG PO CAPS: 75.0000 mg | ORAL_CAPSULE | Freq: Two times a day (BID) | ORAL | 0 refills | Status: DC

## 2016-11-15 NOTE — Telephone Encounter (Signed)
Tamiflu Rx.  Call patient.

## 2016-12-25 ENCOUNTER — Encounter: Payer: Self-pay | Admitting: Obstetrics

## 2016-12-25 ENCOUNTER — Ambulatory Visit (INDEPENDENT_AMBULATORY_CARE_PROVIDER_SITE_OTHER): Payer: BC Managed Care – PPO | Admitting: Obstetrics

## 2016-12-25 VITALS — Ht 65.0 in

## 2016-12-25 DIAGNOSIS — Z01419 Encounter for gynecological examination (general) (routine) without abnormal findings: Secondary | ICD-10-CM | POA: Diagnosis not present

## 2016-12-25 DIAGNOSIS — Z3202 Encounter for pregnancy test, result negative: Secondary | ICD-10-CM | POA: Diagnosis not present

## 2016-12-25 DIAGNOSIS — Z309 Encounter for contraceptive management, unspecified: Secondary | ICD-10-CM

## 2016-12-25 DIAGNOSIS — Z113 Encounter for screening for infections with a predominantly sexual mode of transmission: Secondary | ICD-10-CM

## 2016-12-25 LAB — POCT URINE PREGNANCY: PREG TEST UR: NEGATIVE

## 2016-12-25 MED ORDER — NORETHINDRONE-ETH ESTRADIOL 1-35 MG-MCG PO TABS: 1.0000 | ORAL_TABLET | Freq: Every day | ORAL | 11 refills | Status: DC

## 2016-12-25 NOTE — Progress Notes (Signed)
Subjective:        Andrea Saunders is a 31 y.o. female here for a routine exam.  Current complaints: None.    Personal health questionnaire:  Is patient Ashkenazi Jewish, have a family history of breast and/or ovarian cancer: no Is there a family history of uterine cancer diagnosed at age < 1050, gastrointestinal cancer, urinary tract cancer, family member who is a Personnel officerLynch syndrome-associated carrier: no Is the patient overweight and hypertensive, family history of diabetes, personal history of gestational diabetes, preeclampsia or PCOS: no Is patient over 7155, have PCOS,  family history of premature CHD under age 33, diabetes, smoke, have hypertension or peripheral artery disease:  no At any time, has a partner hit, kicked or otherwise hurt or frightened you?: no Over the past 2 weeks, have you felt down, depressed or hopeless?: no Over the past 2 weeks, have you felt little interest or pleasure in doing things?:no   Gynecologic History Patient's last menstrual period was 12/08/2016. Contraception: abstinence Last Pap: 2017. Results were: normal Last mammogram: n/a. Results were: n/a  Obstetric History OB History  Gravida Para Term Preterm AB Living  4 1 1   3 1   SAB TAB Ectopic Multiple Live Births  1 2     1     # Outcome Date GA Lbr Len/2nd Weight Sex Delivery Anes PTL Lv  4 Term 06/03/14 2864w0d  6 lb 8.2 oz (2.955 kg) M CS-LTranv EPI  LIV  3 SAB 10/02/11 1318w0d         2 TAB 10/01/09          1 TAB 10/01/02              Past Medical History:  Diagnosis Date  . Abortion    x 2  . History of miscarriage     Past Surgical History:  Procedure Laterality Date  . CESAREAN SECTION N/A 06/03/2014   Procedure: CESAREAN SECTION;  Surgeon: Antionette CharLisa Jackson-Moore, MD;  Location: [facility];  Service: Obstetrics;  Laterality: N/A;  . INDUCED ABORTION     x 2  . WISDOM TOOTH EXTRACTION      No current outpatient prescriptions on file. Allergies  Allergen Reactions  . Adhesive [Tape]     Social History  Substance Use Topics  . Smoking status: Never Smoker  . Smokeless tobacco: Never Used  . Alcohol use No    Family History  Problem Relation Age of Onset  . Hypertension Mother   . Hypertension Sister       Review of Systems  Constitutional: negative for fatigue and weight loss Respiratory: negative for cough and wheezing Cardiovascular: negative for chest pain, fatigue and palpitations Gastrointestinal: negative for abdominal pain and change in bowel habits Musculoskeletal:negative for myalgias Neurological: negative for gait problems and tremors Behavioral/Psych: negative for abusive relationship, depression Endocrine: negative for temperature intolerance    Genitourinary:negative for abnormal menstrual periods, genital lesions, hot flashes, sexual problems and vaginal discharge Integument/breast: negative for breast lump, breast tenderness, nipple discharge and skin lesion(s)    Objective:       Ht 5\' 5"  (1.651 m)   LMP 12/08/2016  General:   alert  Skin:   no rash or abnormalities  Lungs:   clear to auscultation bilaterally  Heart:   regular rate and rhythm, S1, S2 normal, no murmur, click, rub or gallop  Breasts:   normal without suspicious masses, skin or nipple changes or axillary nodes  Abdomen:  normal findings: no organomegaly,  soft, non-tender and no hernia  Pelvis:  External genitalia: normal general appearance Urinary system: urethral meatus normal and bladder without fullness, nontender Vaginal: normal without tenderness, induration or masses Cervix: normal appearance Adnexa: normal bimanual exam Uterus: anteverted and non-tender, normal size   Lab Review Urine pregnancy test Labs reviewed yes Radiologic studies reviewed no  50% of 20 min visit spent on counseling and coordination of care.    Assessment:    Healthy female exam.    Plan:    Education reviewed: calcium supplements, depression evaluation, low fat, low cholesterol  diet, safe sex/STD prevention, self breast exams and weight bearing exercise. Contraception: abstinence. Follow up in: 1 year.   No orders of the defined types were placed in this encounter.  Orders Placed This Encounter  Procedures  . HIV antibody  . Hepatitis B surface antigen  . RPR  . Hepatitis C antibody  . POCT urine pregnancy     [name], female   DOB: 02-May-1986, 31 y.o.   MRN: [id number]

## 2016-12-26 LAB — HIV ANTIBODY (ROUTINE TESTING W REFLEX): HIV Screen 4th Generation wRfx: NONREACTIVE

## 2016-12-26 LAB — RPR: RPR: NONREACTIVE

## 2016-12-26 LAB — HEPATITIS C ANTIBODY

## 2016-12-26 LAB — HEPATITIS B SURFACE ANTIGEN: HEP B S AG: NEGATIVE

## 2016-12-27 LAB — CERVICOVAGINAL ANCILLARY ONLY
BACTERIAL VAGINITIS: NEGATIVE
Candida vaginitis: NEGATIVE
Chlamydia: NEGATIVE
Neisseria Gonorrhea: NEGATIVE
TRICH (WINDOWPATH): NEGATIVE

## 2016-12-27 LAB — CYTOLOGY - PAP
DIAGNOSIS: NEGATIVE
HPV (WINDOPATH): NOT DETECTED

## 2017-01-03 ENCOUNTER — Other Ambulatory Visit: Payer: Self-pay | Admitting: *Deleted

## 2017-01-03 DIAGNOSIS — B379 Candidiasis, unspecified: Secondary | ICD-10-CM

## 2017-01-03 MED ORDER — FLUCONAZOLE 150 MG PO TABS: 150.0000 mg | ORAL_TABLET | Freq: Once | ORAL | 0 refills | Status: AC

## 2017-01-03 NOTE — Progress Notes (Signed)
Pt called to office with symptoms of yeast infection, white d/c with itching. Request treatment.   Diflucan  sent per protocol. Advised to contact office if symptoms no better next week.

## 2017-02-04 ENCOUNTER — Telehealth: Payer: Self-pay | Admitting: *Deleted

## 2017-02-04 ENCOUNTER — Encounter: Payer: Self-pay | Admitting: *Deleted

## 2017-02-04 NOTE — Telephone Encounter (Signed)
Pt called to office with concerns of being pregnant when she was seen at her last appt. Pt states she was advised to have pregnancy confirmed at ?care center and had u/s for confirmation. Pt states she was dated at 8 weeks at that visit.   Pt is concerned about pregnancy as she had started taking her Rx of birth control pills.  Pt was reassured that this should not cause any problems with pregnancy. Pt advised that if she is doing well, her pregnancy should be fine at this point.  Pt made aware that if she is having any severe cramping/pain and/or bleeding she needs to be seen. Pt information was taken in order to schedule for NOB appt and can expect a return call to schedule.  Pt advised to call if she has any other concerns.

## 2017-02-07 ENCOUNTER — Telehealth: Payer: Self-pay

## 2017-02-07 NOTE — Telephone Encounter (Signed)
Called Pt to Reschedule  new ob appointment. No answer/ Voice Mail box is full could not leave a message.

## 2017-02-21 ENCOUNTER — Ambulatory Visit (INDEPENDENT_AMBULATORY_CARE_PROVIDER_SITE_OTHER): Payer: BC Managed Care – PPO | Admitting: Obstetrics

## 2017-02-21 ENCOUNTER — Encounter: Payer: Self-pay | Admitting: *Deleted

## 2017-02-21 ENCOUNTER — Encounter: Payer: Self-pay | Admitting: Obstetrics

## 2017-02-21 VITALS — BP 129/79 | HR 85 | Wt 146.4 lb

## 2017-02-21 DIAGNOSIS — Z3687 Encounter for antenatal screening for uncertain dates: Secondary | ICD-10-CM

## 2017-02-21 DIAGNOSIS — Z3480 Encounter for supervision of other normal pregnancy, unspecified trimester: Secondary | ICD-10-CM | POA: Insufficient documentation

## 2017-02-21 DIAGNOSIS — Z3400 Encounter for supervision of normal first pregnancy, unspecified trimester: Secondary | ICD-10-CM

## 2017-02-21 DIAGNOSIS — Z3402 Encounter for supervision of normal first pregnancy, second trimester: Secondary | ICD-10-CM

## 2017-02-21 NOTE — Progress Notes (Signed)
Patient is in the office for initial ob visit, complains of occasional back and pelvic pain.

## 2017-02-21 NOTE — Progress Notes (Signed)
Subjective:    Andrea HaringLatoya S Cly is being seen today for her first obstetrical visit.  This is not a planned pregnancy. She is at 6643w1d gestation. Her obstetrical history is significant for none. Relationship with FOB: significant other, living together. Patient does intend to breast feed. Pregnancy history fully reviewed.  The information documented in the HPI was reviewed and verified.  Menstrual History: OB History    Gravida Para Term Preterm AB Living   5 1 1   3 1    SAB TAB Ectopic Multiple Live Births   1 2     1        Patient's last menstrual period was 12/05/2016.    Past Medical History:  Diagnosis Date  . Abortion    x 2  . History of miscarriage     Past Surgical History:  Procedure Laterality Date  . CESAREAN SECTION N/A 06/03/2014   Procedure: CESAREAN SECTION;  Surgeon: Antionette CharLisa Jackson-Moore, MD;  Location: [facility];  Service: Obstetrics;  Laterality: N/A;  . INDUCED ABORTION     x 2  . WISDOM TOOTH EXTRACTION       (Not in a hospital admission) Allergies  Allergen Reactions  . Adhesive [Tape]     Social History  Substance Use Topics  . Smoking status: Never Smoker  . Smokeless tobacco: Never Used  . Alcohol use No    Family History  Problem Relation Age of Onset  . Hypertension Mother   . Hypertension Sister      Review of Systems Constitutional: negative for weight loss Gastrointestinal: negative for vomiting Genitourinary:negative for genital lesions and vaginal discharge and dysuria Musculoskeletal:negative for back pain Behavioral/Psych: negative for abusive relationship, depression, illegal drug usage and tobacco use    Objective:    BP 129/79   Pulse 85   Wt 146 lb 6.4 oz (66.4 kg)   LMP 12/05/2016   BMI 24.36 kg/m  General Appearance:    Alert, cooperative, no distress, appears stated age  Head:    Normocephalic, without obvious abnormality, atraumatic  Eyes:    PERRL, conjunctiva/corneas clear, EOM's intact, fundi    benign, both eyes   Ears:    Normal TM's and external ear canals, both ears  Nose:   Nares normal, septum midline, mucosa normal, no drainage    or sinus tenderness  Throat:   Lips, mucosa, and tongue normal; teeth and gums normal  Neck:   Supple, symmetrical, trachea midline, no adenopathy;    thyroid:  no enlargement/tenderness/nodules; no carotid   bruit or JVD  Back:     Symmetric, no curvature, ROM normal, no CVA tenderness  Lungs:     Clear to auscultation bilaterally, respirations unlabored  Chest Wall:    No tenderness or deformity   Heart:    Regular rate and rhythm, S1 and S2 normal, no murmur, rub   or gallop  Breast Exam:    No tenderness, masses, or nipple abnormality  Abdomen:     Soft, non-tender, bowel sounds active all four quadrants,    no masses, no organomegaly  Genitalia:    Normal female without lesion, discharge or tenderness  Extremities:   Extremities normal, atraumatic, no cyanosis or edema  Pulses:   2+ and symmetric all extremities  Skin:   Skin color, texture, turgor normal, no rashes or lesions  Lymph nodes:   Cervical, supraclavicular, and axillary nodes normal  Neurologic:   CNII-XII intact, normal strength, sensation and reflexes    throughout  Lab Review Urine pregnancy test Labs reviewed yes Radiologic studies reviewed yes  Assessment:    Pregnancy at [redacted]w[redacted]d weeks .  Unsure LNMP   Plan:     1. Unsure of LMP (last menstrual period) as reason for ultrasound scan Rx: - US OB Comp Less 14 Wks; Future - US OB Transvaginal; Future  2. Supervision of normal first pregnancy, antepartum Rx: - Hemoglobinopathy evaluation - Varicella zoster antibody, IgG - Obstetric Panel, Including HIV - Culture, OB Urine - Vitamin D (25 hydroxy) Prenatal vitamins.  Counseling provided regarding continued use of seat belts, cessation of alcohol consumption, smoking or use of illicit drugs; infection precautions i.e., influenza/TDAP immunizations, toxoplasmosis,CMV, parvovirus,  listeria and varicella; workplace safety, exercise during pregnancy; routine dental care, safe medications, sexual activity, hot tubs, saunas, pools, travel, caffeine use, fish and methlymercury, potential toxins, hair treatments, varicose veins Weight gain recommendations per IOM guidelines reviewed: underweight/BMI< 18.5--> gain 28 - 40 lbs; normal weight/BMI 18.5 - 24.9--> gain 25 - 35 lbs; overweight/BMI 25 - 29.9--> gain 15 - 25 lbs; obese/BMI >30->gain  11 - 20 lbs Problem list reviewed and updated. FIRST/CF mutation testing/NIPT/QUAD SCREEN/fragile X/Ashkenazi Jewish population testing/Spinal muscular atrophy discussed: requested. Role of ultrasound in pregnancy discussed; fetal survey: requested. Amniocentesis discussed: not indicated. VBAC calculator score: VBAC consent form provided Meds ordered this encounter  Medications  . Prenatal Vit-Fe Fumarate-FA (MULTIVITAMIN-PRENATAL) 27-0.8 MG TABS tablet    Sig: Take 1 tablet by mouth daily at 12 noon.   Orders Placed This Encounter  Procedures  . Culture, OB Urine  . US OB Comp Less 14 Wks    Standing Status:   Future    Standing Expiration Date:   04/23/2018    Order Specific Question:   Reason for Exam (SYMPTOM  OR DIAGNOSIS REQUIRED)    Answer:   Unsure LMP    Order Specific Question:   Preferred imaging location?    Answer:   [facility]  . US OB Transvaginal    Standing Status:   Future    Standing Expiration Date:   04/23/2018    Order Specific Question:   Reason for Exam (SYMPTOM  OR DIAGNOSIS REQUIRED)    Answer:   Unsure LMP    Order Specific Question:   Preferred imaging location?    Answer:   [facility]  . Hemoglobinopathy evaluation  . Varicella zoster antibody, IgG  . Obstetric Panel, Including HIV  . Vitamin D (25 hydroxy)    Follow up in 2 weeks. 50% of 20 min visit spent on counseling and coordination of care. [name], female   DOB: 1986-03-04, 31 y.o.   MRN: [id number]

## 2017-02-24 LAB — URINE CULTURE, OB REFLEX

## 2017-02-24 LAB — CULTURE, OB URINE

## 2017-02-26 ENCOUNTER — Other Ambulatory Visit: Payer: Self-pay | Admitting: Obstetrics

## 2017-02-26 DIAGNOSIS — E559 Vitamin D deficiency, unspecified: Secondary | ICD-10-CM

## 2017-02-26 LAB — HEMOGLOBINOPATHY EVALUATION
HGB C: 0 %
HGB S: 0 %
HGB VARIANT: 0 %
Hemoglobin A2 Quantitation: 2.3 % (ref 1.8–3.2)
Hemoglobin F Quantitation: 0.8 % (ref 0.0–2.0)
Hgb A: 96.9 % (ref 96.4–98.8)

## 2017-02-26 LAB — OBSTETRIC PANEL, INCLUDING HIV
ANTIBODY SCREEN: NEGATIVE
BASOS: 0 %
Basophils Absolute: 0 10*3/uL (ref 0.0–0.2)
EOS (ABSOLUTE): 0.1 10*3/uL (ref 0.0–0.4)
EOS: 2 %
HEMATOCRIT: 39 % (ref 34.0–46.6)
HEMOGLOBIN: 12.9 g/dL (ref 11.1–15.9)
HEP B S AG: NEGATIVE
HIV Screen 4th Generation wRfx: NONREACTIVE
IMMATURE GRANS (ABS): 0 10*3/uL (ref 0.0–0.1)
Immature Granulocytes: 0 %
Lymphocytes Absolute: 1.7 10*3/uL (ref 0.7–3.1)
Lymphs: 22 %
MCH: 32.2 pg (ref 26.6–33.0)
MCHC: 33.1 g/dL (ref 31.5–35.7)
MCV: 97 fL (ref 79–97)
MONOCYTES: 8 %
MONOS ABS: 0.6 10*3/uL (ref 0.1–0.9)
Neutrophils Absolute: 5.3 10*3/uL (ref 1.4–7.0)
Neutrophils: 68 %
Platelets: 323 10*3/uL (ref 150–379)
RBC: 4.01 x10E6/uL (ref 3.77–5.28)
RDW: 13.1 % (ref 12.3–15.4)
RH TYPE: POSITIVE
RPR: NONREACTIVE
RUBELLA: 4.08 {index} (ref >0.99)
WBC: 7.7 10*3/uL (ref 3.4–10.8)

## 2017-02-26 LAB — VITAMIN D 25 HYDROXY (VIT D DEFICIENCY, FRACTURES): Vit D, 25-Hydroxy: 22.4 ng/mL — ABNORMAL LOW (ref 30.0–100.0)

## 2017-02-26 LAB — VARICELLA ZOSTER ANTIBODY, IGG: Varicella zoster IgG: 1203 index (ref >165)

## 2017-02-26 MED ORDER — VITAMIN D 50 MCG (2000 UT) PO CAPS: 1.0000 | ORAL_CAPSULE | Freq: Every day | ORAL | 5 refills | Status: AC

## 2017-03-01 ENCOUNTER — Ambulatory Visit (HOSPITAL_COMMUNITY)
Admission: RE | Admit: 2017-03-01 | Discharge: 2017-03-01 | Disposition: A | Discharge status: Home / Self Care | Payer: BC Managed Care – PPO | Source: Ambulatory Visit | Attending: Obstetrics | Admitting: Obstetrics

## 2017-03-01 ENCOUNTER — Other Ambulatory Visit: Payer: Self-pay | Admitting: Obstetrics

## 2017-03-01 DIAGNOSIS — Z3687 Encounter for antenatal screening for uncertain dates: Secondary | ICD-10-CM

## 2017-03-07 ENCOUNTER — Ambulatory Visit (INDEPENDENT_AMBULATORY_CARE_PROVIDER_SITE_OTHER): Payer: BC Managed Care – PPO | Admitting: Obstetrics

## 2017-03-07 ENCOUNTER — Encounter: Payer: Self-pay | Admitting: Obstetrics

## 2017-03-07 DIAGNOSIS — Z3400 Encounter for supervision of normal first pregnancy, unspecified trimester: Secondary | ICD-10-CM

## 2017-03-07 DIAGNOSIS — Z3482 Encounter for supervision of other normal pregnancy, second trimester: Secondary | ICD-10-CM

## 2017-03-07 NOTE — Progress Notes (Signed)
Patient reports no complaints today, states that she has not picked up Vit D rx but has been in more sunlight.

## 2017-03-07 NOTE — Progress Notes (Signed)
Subjective:  Andrea Saunders is a 31 y.o. G5P1031 at 5955w1d being seen today for ongoing prenatal care.  She is currently monitored for the following issues for this low-risk pregnancy and has Backache with radiation; Vaginitis and vulvovaginitis; Abdominal pain in female patient; BV (bacterial vaginosis); Nausea and vomiting in pregnancy prior to [redacted] weeks gestation; Labor and delivery, indication for care; Chorioamnionitis, delivered, current hospitalization; Variable fetal heart rate decelerations, delivered; Cesarean delivery delivered; Pregnancy induced hypertension; and Supervision of normal first pregnancy, antepartum on her problem list.  Patient reports no complaints.  Contractions: Not present. Vag. Bleeding: None.   . Denies leaking of fluid.   The following portions of the patient's history were reviewed and updated as appropriate: allergies, current medications, past family history, past medical history, past social history, past surgical history and problem list. Problem list updated.  Objective:   Vitals:   03/07/17 1532  BP: 130/89  Pulse: 79  Weight: 151 lb 9.6 oz (68.8 kg)    Fetal Status: Fetal Heart Rate (bpm): 150         General:  Alert, oriented and cooperative. Patient is in no acute distress.  Skin: Skin is warm and dry. No rash noted.   Cardiovascular: Normal heart rate noted  Respiratory: Normal respiratory effort, no problems with respiration noted  Abdomen: Soft, gravid, appropriate for gestational age. Pain/Pressure: Absent     Pelvic:  Cervical exam deferred        Extremities: Normal range of motion.  Edema: None  Mental Status: Normal mood and affect. Normal behavior. Normal judgment and thought content.   Urinalysis:      Assessment and Plan:  Pregnancy: G5P1031 at 5655w1d  1. Supervision of normal first pregnancy, antepartum   Preterm labor symptoms and general obstetric precautions including but not limited to vaginal bleeding, contractions, leaking  of fluid and fetal movement were reviewed in detail with the patient. Please refer to After Visit Summary for other counseling recommendations.  Return in 2 weeks (on 03/21/2017) for ROB.   Brock BadHarper, Madilyne Tadlock A, MDPatient ID: Andrea Saunders, female   DOB: [birth year], 31 y.o.   MRN: [id number]

## 2017-03-21 ENCOUNTER — Encounter: Payer: Self-pay | Admitting: Obstetrics

## 2017-03-21 ENCOUNTER — Ambulatory Visit (INDEPENDENT_AMBULATORY_CARE_PROVIDER_SITE_OTHER): Payer: BC Managed Care – PPO | Admitting: Obstetrics

## 2017-03-21 VITALS — BP 128/81 | HR 91 | Wt 152.2 lb

## 2017-03-21 DIAGNOSIS — Z3480 Encounter for supervision of other normal pregnancy, unspecified trimester: Secondary | ICD-10-CM

## 2017-03-21 DIAGNOSIS — Z3482 Encounter for supervision of other normal pregnancy, second trimester: Secondary | ICD-10-CM

## 2017-03-21 NOTE — Progress Notes (Signed)
Subjective:  Andrea Saunders is a 31 y.o. G5P1031 at 9277w1d being seen today for ongoing prenatal care.  She is currently monitored for the following issues for this low-risk pregnancy and has Backache with radiation; Vaginitis and vulvovaginitis; Abdominal pain in female patient; BV (bacterial vaginosis); Nausea and vomiting in pregnancy prior to [redacted] weeks gestation; Labor and delivery, indication for care; Chorioamnionitis, delivered, current hospitalization; Variable fetal heart rate decelerations, delivered; Cesarean delivery delivered; Pregnancy induced hypertension; and Supervision of other normal pregnancy, antepartum on her problem list.  Patient reports no complaints.  Contractions: Not present. Vag. Bleeding: None.   . Denies leaking of fluid.   The following portions of the patient's history were reviewed and updated as appropriate: allergies, current medications, past family history, past medical history, past social history, past surgical history and problem list. Problem list updated.  Objective:   Vitals:   03/21/17 0858  BP: 128/81  Pulse: 91  Weight: 152 lb 3.2 oz (69 kg)    Fetal Status: Fetal Heart Rate (bpm): 150         General:  Alert, oriented and cooperative. Patient is in no acute distress.  Skin: Skin is warm and dry. No rash noted.   Cardiovascular: Normal heart rate noted  Respiratory: Normal respiratory effort, no problems with respiration noted  Abdomen: Soft, gravid, appropriate for gestational age. Pain/Pressure: Absent     Pelvic:  Cervical exam deferred        Extremities: Normal range of motion.  Edema: None  Mental Status: Normal mood and affect. Normal behavior. Normal judgment and thought content.   Urinalysis:      Assessment and Plan:  Pregnancy: G5P1031 at 7177w1d  1. Supervision of other normal pregnancy, antepartum Rx: - AFP TETRA  Preterm labor symptoms and general obstetric precautions including but not limited to vaginal bleeding,  contractions, leaking of fluid and fetal movement were reviewed in detail with the patient. Please refer to After Visit Summary for other counseling recommendations.  Return in about 4 weeks (around 04/18/2017) for ROB.   Brock BadHarper, Tomoya Ringwald A, MDPatient ID: Andrea Saunders, female   DOB: 1985-10-23, 31 y.o.   MRN: [id number]

## 2017-03-21 NOTE — Progress Notes (Signed)
Patient reports no concerns today 

## 2017-03-26 LAB — AFP TETRA
DIA MOM VALUE: 0.4
DIA Value (EIA): 72.89 pg/mL
DSR (BY AGE) 1 IN: 389
DSR (Second Trimester) 1 IN: 5236
GESTATIONAL AGE AFP: 15.1 wk
MATERNAL AGE AT EDD: 33.7 a
MSAFP Mom: 0.66
MSAFP: 20.4 ng/mL
MSHCG MOM: 0.76
MSHCG: 38359 m[IU]/mL
Osb Risk: 10000
T18 (By Age): 1:1517 {titer}
Test Results:: NEGATIVE
UE3 MOM: 0.81
UE3 VALUE: 0.46 ng/mL
Weight: 152 [lb_av]

## 2017-12-04 ENCOUNTER — Encounter (HOSPITAL_COMMUNITY): Payer: Self-pay

## 2019-03-24 IMAGING — US US OB COMP LESS 14 WK
1 series · 15 of 28 positions shown · non-contrast
Comparison: None.

CLINICAL DATA: Pregnant patient.  Unsure of last menstrual.

EXAM:
OBSTETRIC <14 WK ULTRASOUND
TECHNIQUE: Transabdominal ultrasound was performed for evaluation of the
gestation as well as the maternal uterus and adnexal regions.

[Series 1: us ob comp less 14 wk · 15 of 28 slices shown]
[im 1/28]
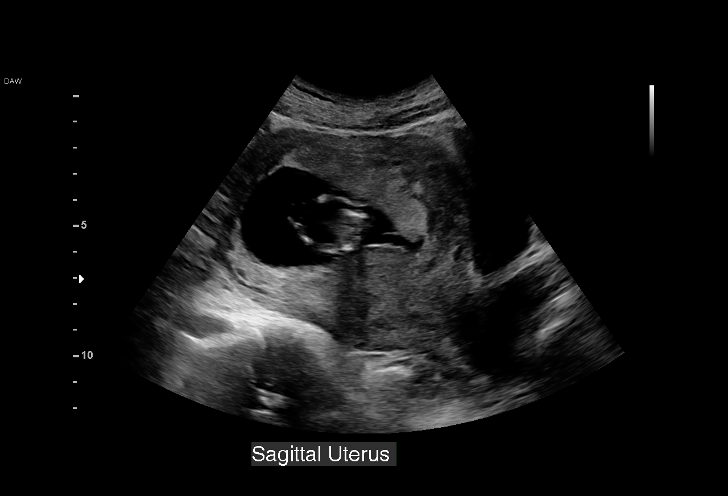
[im 3/28]
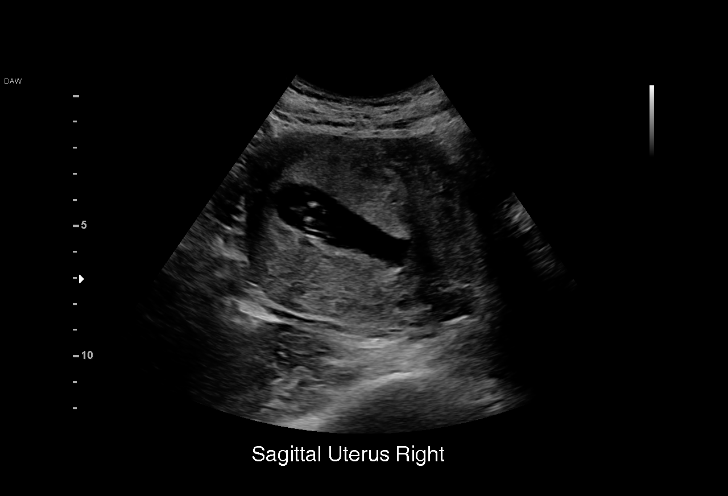
[im 5/28]
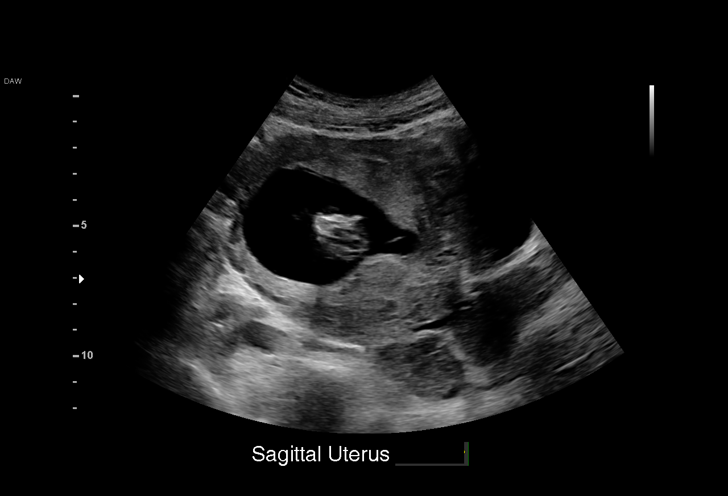
[im 7/28]
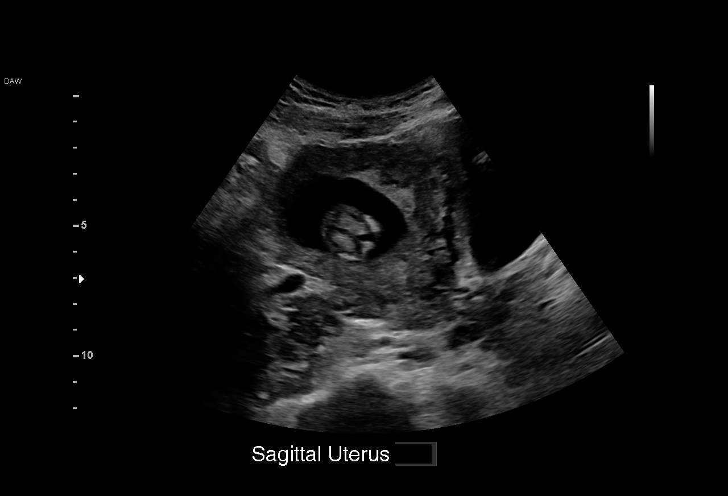
[im 9/28]
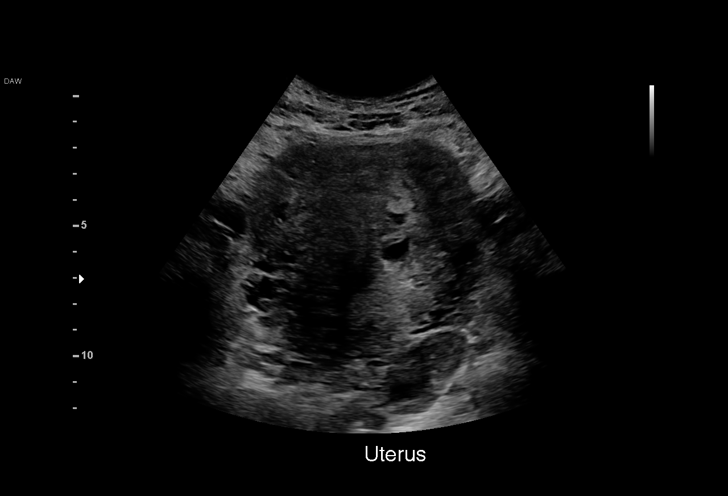
[im 11/28]
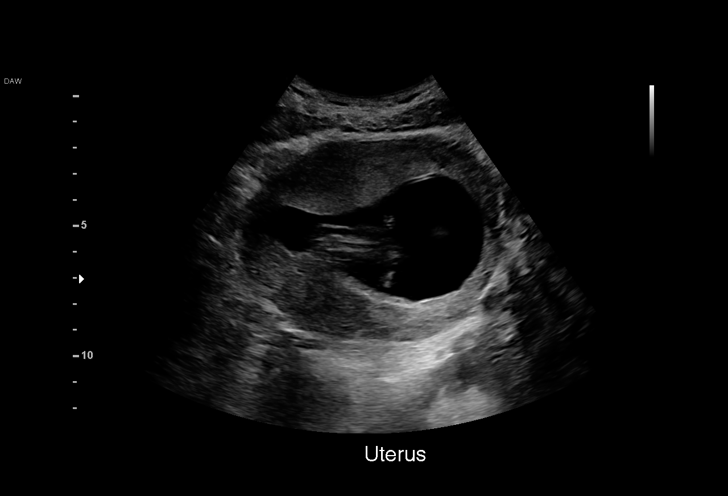
[im 13/28]
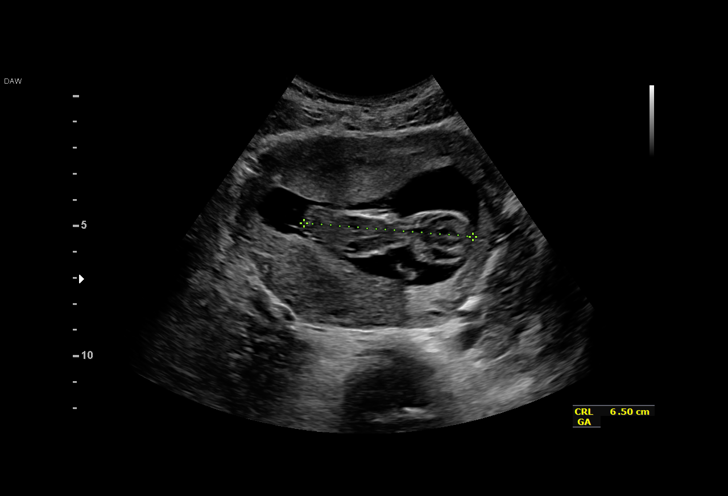
[im 15/28]
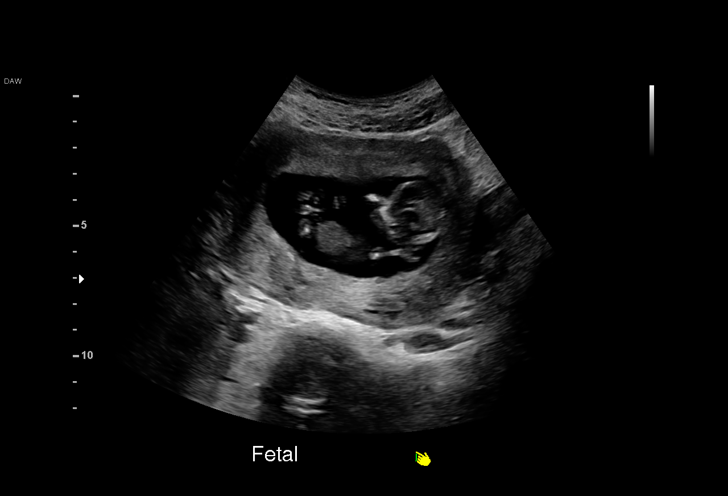
[im 16/28]
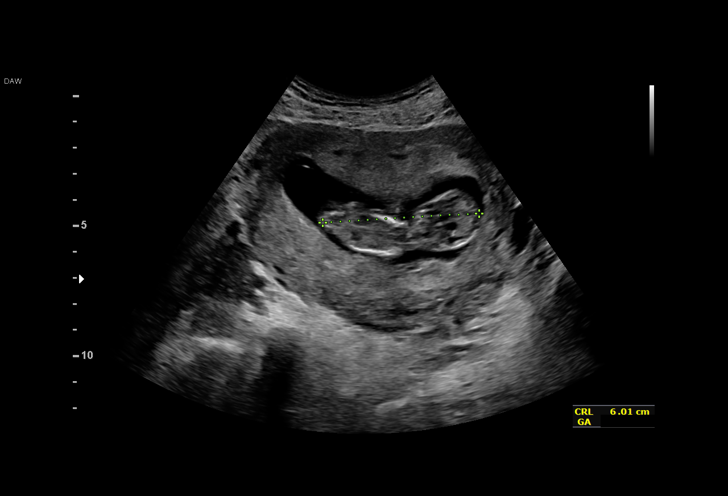
[im 18/28]
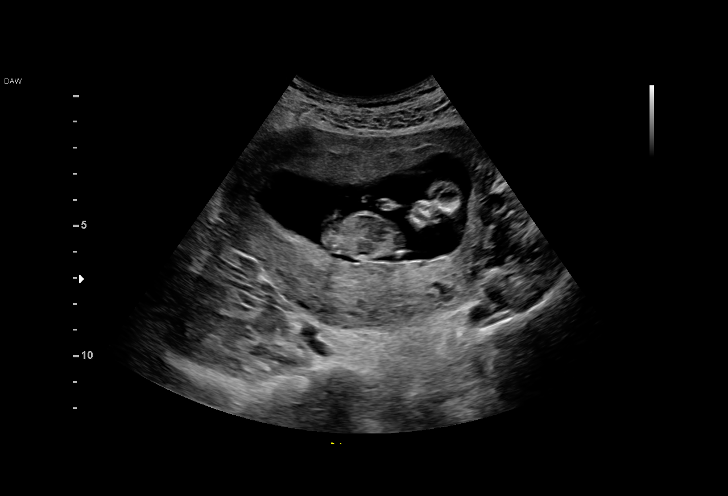
[im 20/28]
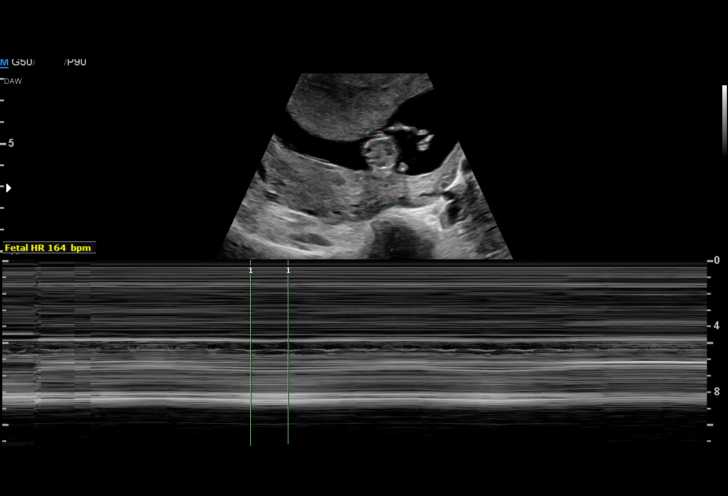
[im 22/28]
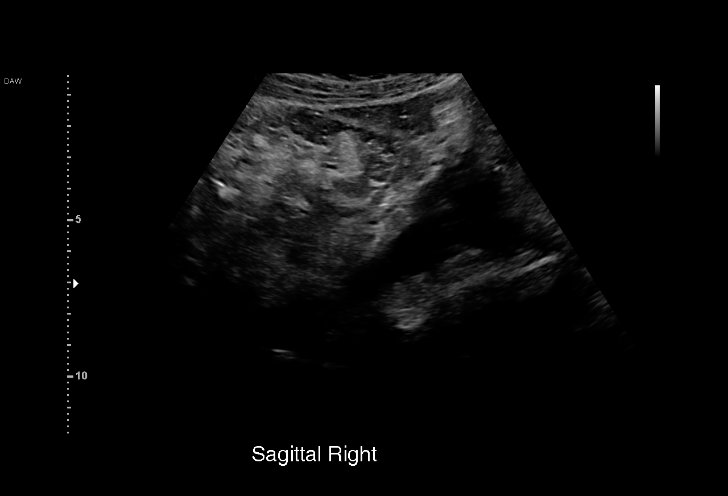
[im 24/28]
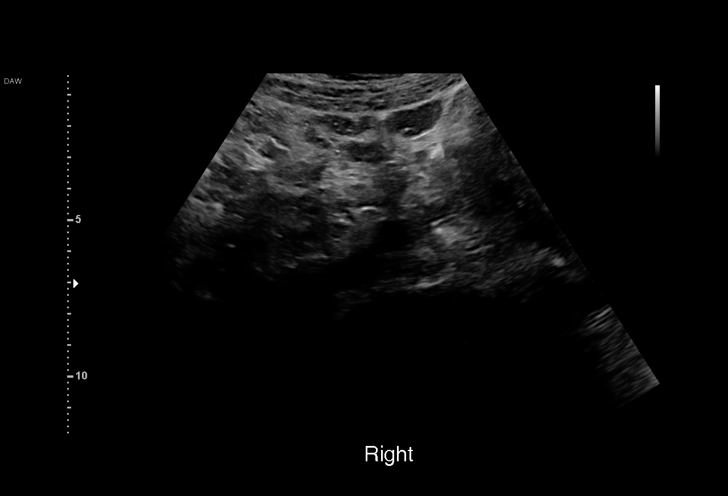
[im 26/28]
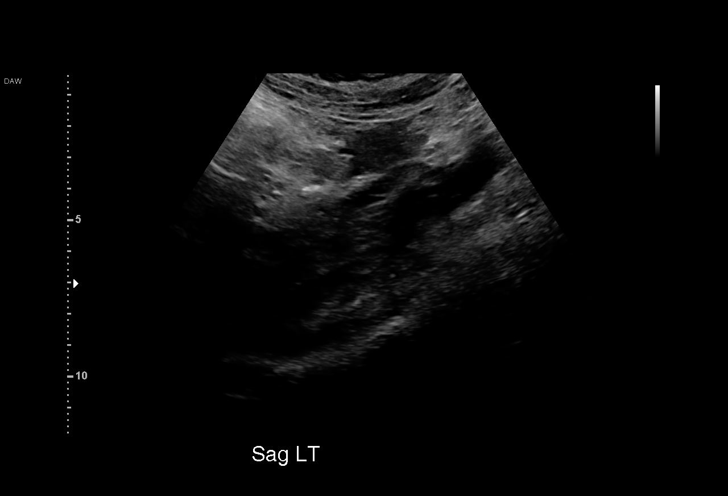
[im 28/28]
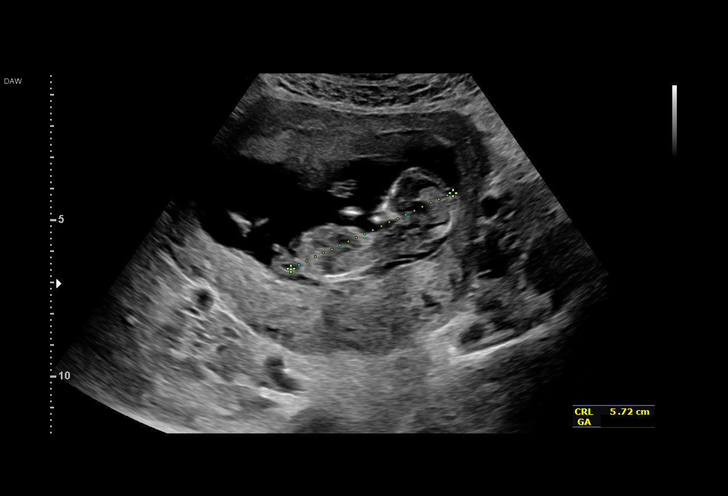

[15 of 28 positions shown; findings below may reference images not displayed]

FINDINGS: Intrauterine gestational sac: Single

Yolk sac:  Not Visualized.

Embryo:  Visualized.

Cardiac Activity: Visualized.

Heart Rate: 164 bpm

CRL:   58.8  mm   12 w 2 d                  US EDC: 09/11/2017

Subchorionic hemorrhage:  None visualized.

Maternal uterus/adnexae: The ovaries are not visualized. No free
fluid in the pelvis.
IMPRESSION: Single live intrauterine gestation.  No subchorionic hemorrhage.
